# Patient Record
Sex: Male | Born: 1955 | Race: White | Hispanic: No | Marital: Married | Smoking: Never smoker
Health system: Southern US, Community
[De-identification: ages and names within clinical notes are randomized; demographics above are authoritative.]

## PROBLEM LIST (undated history)

## (undated) DIAGNOSIS — C9110 Chronic lymphocytic leukemia of B-cell type not having achieved remission: Secondary | ICD-10-CM

## (undated) DIAGNOSIS — R55 Syncope and collapse: Secondary | ICD-10-CM

## (undated) DIAGNOSIS — Z8669 Personal history of other diseases of the nervous system and sense organs: Secondary | ICD-10-CM

## (undated) DIAGNOSIS — F329 Major depressive disorder, single episode, unspecified: Secondary | ICD-10-CM

## (undated) DIAGNOSIS — I10 Essential (primary) hypertension: Secondary | ICD-10-CM

## (undated) DIAGNOSIS — F419 Anxiety disorder, unspecified: Secondary | ICD-10-CM

## (undated) DIAGNOSIS — R011 Cardiac murmur, unspecified: Secondary | ICD-10-CM

## (undated) DIAGNOSIS — Z87442 Personal history of urinary calculi: Secondary | ICD-10-CM

## (undated) DIAGNOSIS — F32A Depression, unspecified: Secondary | ICD-10-CM

## (undated) DIAGNOSIS — Z8739 Personal history of other diseases of the musculoskeletal system and connective tissue: Secondary | ICD-10-CM

## (undated) DIAGNOSIS — K219 Gastro-esophageal reflux disease without esophagitis: Secondary | ICD-10-CM

## (undated) HISTORY — DX: Cardiac murmur, unspecified: R01.1

## (undated) HISTORY — DX: Depression, unspecified: F32.A

## (undated) HISTORY — DX: Major depressive disorder, single episode, unspecified: F32.9

## (undated) HISTORY — PX: KNEE SURGERY: SHX244

## (undated) HISTORY — DX: Chronic lymphocytic leukemia of B-cell type not having achieved remission: C91.10

## (undated) HISTORY — DX: Personal history of urinary calculi: Z87.442

## (undated) HISTORY — PX: TONSILLECTOMY AND ADENOIDECTOMY: SUR1326

## (undated) HISTORY — DX: Essential (primary) hypertension: I10

## (undated) HISTORY — DX: Syncope and collapse: R55

## (undated) HISTORY — DX: Personal history of other diseases of the musculoskeletal system and connective tissue: Z87.39

## (undated) HISTORY — PX: COLONOSCOPY W/ POLYPECTOMY: SHX1380

## (undated) HISTORY — DX: Gastro-esophageal reflux disease without esophagitis: K21.9

## (undated) HISTORY — DX: Anxiety disorder, unspecified: F41.9

## (undated) HISTORY — DX: Personal history of other diseases of the nervous system and sense organs: Z86.69

---

## 2006-10-13 ENCOUNTER — Ambulatory Visit: Payer: Self-pay | Admitting: Emergency Medicine

## 2006-11-03 ENCOUNTER — Ambulatory Visit: Payer: Self-pay | Admitting: Internal Medicine

## 2007-02-07 ENCOUNTER — Ambulatory Visit: Payer: Self-pay | Admitting: Family Medicine

## 2007-03-03 ENCOUNTER — Ambulatory Visit: Payer: Self-pay | Admitting: Internal Medicine

## 2007-04-03 ENCOUNTER — Ambulatory Visit: Payer: Self-pay | Admitting: Emergency Medicine

## 2007-05-02 ENCOUNTER — Ambulatory Visit: Payer: Self-pay | Admitting: Emergency Medicine

## 2007-05-18 ENCOUNTER — Ambulatory Visit: Payer: Self-pay | Admitting: Emergency Medicine

## 2007-07-18 ENCOUNTER — Ambulatory Visit: Payer: Self-pay | Admitting: Internal Medicine

## 2007-07-31 ENCOUNTER — Ambulatory Visit: Payer: Self-pay | Admitting: Family Medicine

## 2007-08-05 ENCOUNTER — Ambulatory Visit: Payer: Self-pay | Admitting: Family Medicine

## 2007-08-06 ENCOUNTER — Ambulatory Visit: Payer: Self-pay | Admitting: Oncology

## 2007-08-27 ENCOUNTER — Ambulatory Visit: Payer: Self-pay | Admitting: Oncology

## 2007-09-05 ENCOUNTER — Ambulatory Visit: Payer: Self-pay | Admitting: Oncology

## 2007-10-06 ENCOUNTER — Ambulatory Visit: Payer: Self-pay | Admitting: Oncology

## 2007-11-03 ENCOUNTER — Ambulatory Visit: Payer: Self-pay | Admitting: Oncology

## 2007-11-05 ENCOUNTER — Ambulatory Visit: Payer: Self-pay | Admitting: Oncology

## 2007-11-26 ENCOUNTER — Ambulatory Visit: Payer: Self-pay | Admitting: Internal Medicine

## 2007-12-06 ENCOUNTER — Ambulatory Visit: Payer: Self-pay | Admitting: Oncology

## 2007-12-30 ENCOUNTER — Ambulatory Visit: Payer: Self-pay | Admitting: Family Medicine

## 2008-01-06 ENCOUNTER — Ambulatory Visit: Payer: Self-pay | Admitting: Oncology

## 2008-01-07 ENCOUNTER — Inpatient Hospital Stay: Payer: Self-pay | Admitting: Oncology

## 2008-01-11 ENCOUNTER — Ambulatory Visit: Payer: Self-pay | Admitting: Oncology

## 2008-01-25 ENCOUNTER — Ambulatory Visit: Payer: Self-pay | Admitting: Internal Medicine

## 2008-02-05 ENCOUNTER — Ambulatory Visit: Payer: Self-pay | Admitting: Oncology

## 2008-03-07 ENCOUNTER — Ambulatory Visit: Payer: Self-pay | Admitting: Oncology

## 2008-04-06 ENCOUNTER — Ambulatory Visit: Payer: Self-pay | Admitting: Oncology

## 2008-05-07 ENCOUNTER — Ambulatory Visit: Payer: Self-pay | Admitting: Oncology

## 2008-06-07 ENCOUNTER — Ambulatory Visit: Payer: Self-pay | Admitting: Oncology

## 2008-07-05 ENCOUNTER — Ambulatory Visit: Payer: Self-pay | Admitting: Oncology

## 2008-08-02 ENCOUNTER — Ambulatory Visit: Payer: Self-pay | Admitting: Oncology

## 2008-08-05 ENCOUNTER — Ambulatory Visit: Payer: Self-pay | Admitting: Oncology

## 2008-08-30 ENCOUNTER — Ambulatory Visit: Payer: Self-pay | Admitting: Oncology

## 2008-09-01 ENCOUNTER — Ambulatory Visit: Payer: Self-pay | Admitting: Oncology

## 2008-09-04 ENCOUNTER — Ambulatory Visit: Payer: Self-pay | Admitting: Oncology

## 2008-09-27 ENCOUNTER — Emergency Department: Payer: Self-pay | Admitting: Emergency Medicine

## 2008-10-06 ENCOUNTER — Ambulatory Visit: Payer: Self-pay | Admitting: Internal Medicine

## 2008-11-04 ENCOUNTER — Ambulatory Visit: Payer: Self-pay | Admitting: Oncology

## 2008-12-01 ENCOUNTER — Ambulatory Visit: Payer: Self-pay | Admitting: Oncology

## 2008-12-05 ENCOUNTER — Ambulatory Visit: Payer: Self-pay | Admitting: Oncology

## 2009-01-05 ENCOUNTER — Ambulatory Visit: Payer: Self-pay

## 2009-01-11 ENCOUNTER — Emergency Department: Payer: Self-pay | Admitting: Emergency Medicine

## 2009-01-14 ENCOUNTER — Ambulatory Visit: Payer: Self-pay | Admitting: Internal Medicine

## 2009-01-18 ENCOUNTER — Ambulatory Visit: Payer: Self-pay

## 2009-02-04 ENCOUNTER — Ambulatory Visit: Payer: Self-pay

## 2009-02-22 ENCOUNTER — Ambulatory Visit: Payer: Self-pay | Admitting: Urology

## 2009-03-07 ENCOUNTER — Ambulatory Visit: Payer: Self-pay

## 2009-03-10 ENCOUNTER — Ambulatory Visit: Payer: Self-pay | Admitting: Urology

## 2009-03-24 ENCOUNTER — Ambulatory Visit: Payer: Self-pay | Admitting: Urology

## 2009-04-06 ENCOUNTER — Ambulatory Visit: Payer: Self-pay | Admitting: Oncology

## 2009-04-19 ENCOUNTER — Ambulatory Visit: Payer: Self-pay | Admitting: Oncology

## 2009-05-07 ENCOUNTER — Ambulatory Visit: Payer: Self-pay | Admitting: Oncology

## 2009-06-07 ENCOUNTER — Ambulatory Visit: Payer: Self-pay | Admitting: Oncology

## 2009-06-15 ENCOUNTER — Ambulatory Visit: Payer: Self-pay | Admitting: Oncology

## 2009-07-05 ENCOUNTER — Ambulatory Visit: Payer: Self-pay | Admitting: Oncology

## 2009-09-04 ENCOUNTER — Ambulatory Visit: Payer: Self-pay | Admitting: Oncology

## 2009-09-21 ENCOUNTER — Ambulatory Visit: Payer: Self-pay | Admitting: Oncology

## 2009-09-25 ENCOUNTER — Ambulatory Visit: Payer: Self-pay | Admitting: Internal Medicine

## 2009-10-05 ENCOUNTER — Ambulatory Visit: Payer: Self-pay | Admitting: Oncology

## 2009-11-04 ENCOUNTER — Ambulatory Visit: Payer: Self-pay | Admitting: Oncology

## 2009-11-14 ENCOUNTER — Ambulatory Visit: Payer: Self-pay

## 2009-12-02 ENCOUNTER — Ambulatory Visit: Payer: Self-pay | Admitting: Oncology

## 2009-12-05 ENCOUNTER — Ambulatory Visit: Payer: Self-pay | Admitting: Oncology

## 2009-12-16 ENCOUNTER — Ambulatory Visit: Payer: Self-pay | Admitting: General Practice

## 2009-12-23 ENCOUNTER — Ambulatory Visit: Payer: Self-pay | Admitting: General Practice

## 2010-01-07 ENCOUNTER — Ambulatory Visit: Payer: Self-pay | Admitting: Internal Medicine

## 2010-01-07 ENCOUNTER — Emergency Department: Payer: Self-pay | Admitting: Emergency Medicine

## 2010-02-04 ENCOUNTER — Ambulatory Visit: Payer: Self-pay | Admitting: Oncology

## 2010-02-14 ENCOUNTER — Ambulatory Visit: Payer: Self-pay | Admitting: Oncology

## 2010-02-16 ENCOUNTER — Ambulatory Visit: Payer: Self-pay | Admitting: Family Medicine

## 2010-03-07 ENCOUNTER — Ambulatory Visit: Payer: Self-pay | Admitting: Oncology

## 2010-04-06 ENCOUNTER — Ambulatory Visit: Payer: Self-pay | Admitting: Oncology

## 2010-05-07 ENCOUNTER — Ambulatory Visit: Payer: Self-pay | Admitting: Oncology

## 2010-07-20 ENCOUNTER — Ambulatory Visit: Payer: Self-pay | Admitting: Oncology

## 2010-08-06 ENCOUNTER — Ambulatory Visit: Payer: Self-pay | Admitting: Oncology

## 2010-08-11 ENCOUNTER — Ambulatory Visit: Payer: Self-pay | Admitting: Internal Medicine

## 2010-09-30 ENCOUNTER — Ambulatory Visit: Payer: Self-pay | Admitting: Family Medicine

## 2010-10-20 ENCOUNTER — Ambulatory Visit: Payer: Self-pay | Admitting: Oncology

## 2010-11-05 ENCOUNTER — Ambulatory Visit: Payer: Self-pay | Admitting: Oncology

## 2011-01-22 ENCOUNTER — Ambulatory Visit: Payer: Self-pay | Admitting: Oncology

## 2011-02-05 ENCOUNTER — Ambulatory Visit: Payer: Self-pay | Admitting: Oncology

## 2011-04-01 ENCOUNTER — Ambulatory Visit: Payer: Self-pay

## 2011-05-09 LAB — HM COLONOSCOPY: HM Colonoscopy: NORMAL

## 2011-05-24 ENCOUNTER — Ambulatory Visit: Payer: Self-pay | Admitting: Oncology

## 2011-05-24 LAB — CBC CANCER CENTER
Basophil #: 0 x10 3/mm (ref 0.0–0.1)
Eosinophil %: 1.1 %
HCT: 37.2 % — ABNORMAL LOW (ref 40.0–52.0)
HGB: 12.9 g/dL — ABNORMAL LOW (ref 13.0–18.0)
Lymphocyte #: 1.3 x10 3/mm (ref 1.0–3.6)
Lymphocyte %: 33.8 %
MCHC: 34.8 g/dL (ref 32.0–36.0)
MCV: 98 fL (ref 80–100)
Monocyte %: 9.1 %
Neutrophil #: 2 x10 3/mm (ref 1.4–6.5)
Neutrophil %: 55 %
RDW: 13.7 % (ref 11.5–14.5)
WBC: 3.7 x10 3/mm — ABNORMAL LOW (ref 3.8–10.6)

## 2011-05-24 LAB — COMPREHENSIVE METABOLIC PANEL
Anion Gap: 12 (ref 7–16)
Bilirubin,Total: 0.9 mg/dL (ref 0.2–1.0)
Chloride: 101 mmol/L (ref 98–107)
Co2: 32 mmol/L (ref 21–32)
EGFR (African American): >60
EGFR (Non-African Amer.): >60
Glucose: 95 mg/dL (ref 65–99)
Osmolality: 288 (ref 275–301)
SGPT (ALT): 53 U/L
Sodium: 145 mmol/L (ref 136–145)
Total Protein: 6.5 g/dL (ref 6.4–8.2)

## 2011-06-08 ENCOUNTER — Ambulatory Visit: Payer: Self-pay | Admitting: Oncology

## 2011-06-28 LAB — COMPREHENSIVE METABOLIC PANEL
Albumin: 3.3 g/dL — ABNORMAL LOW (ref 3.4–5.0)
Alkaline Phosphatase: 124 U/L (ref 50–136)
Anion Gap: 7 (ref 7–16)
Osmolality: 271 (ref 275–301)
Potassium: 3 mmol/L — ABNORMAL LOW (ref 3.5–5.1)
Sodium: 136 mmol/L (ref 136–145)
Total Protein: 7 g/dL (ref 6.4–8.2)

## 2011-06-28 LAB — CBC CANCER CENTER
Basophil #: 0 x10 3/mm (ref 0.0–0.1)
Eosinophil #: 0 x10 3/mm (ref 0.0–0.7)
Eosinophil %: 1.3 %
MCH: 33.4 pg (ref 26.0–34.0)
MCHC: 35.1 g/dL (ref 32.0–36.0)
Monocyte %: 10.4 %
Neutrophil #: 1.8 x10 3/mm (ref 1.4–6.5)
Neutrophil %: 52.1 %
Platelet: 177 x10 3/mm (ref 150–440)
RBC: 4.71 10*6/uL (ref 4.40–5.90)
WBC: 3.5 x10 3/mm — ABNORMAL LOW (ref 3.8–10.6)

## 2011-07-06 ENCOUNTER — Ambulatory Visit: Payer: Self-pay | Admitting: Oncology

## 2011-08-19 ENCOUNTER — Emergency Department: Payer: Self-pay | Admitting: *Deleted

## 2011-09-24 ENCOUNTER — Ambulatory Visit: Payer: Self-pay | Admitting: Oncology

## 2011-09-24 LAB — COMPREHENSIVE METABOLIC PANEL
Albumin: 3.2 g/dL — ABNORMAL LOW (ref 3.4–5.0)
Alkaline Phosphatase: 164 U/L — ABNORMAL HIGH (ref 50–136)
Bilirubin,Total: 1.1 mg/dL — ABNORMAL HIGH (ref 0.2–1.0)
Co2: 23 mmol/L (ref 21–32)
Creatinine: 0.6 mg/dL (ref 0.60–1.30)
EGFR (African American): >60
Glucose: 94 mg/dL (ref 65–99)
Osmolality: 273 (ref 275–301)
Potassium: 3.1 mmol/L — ABNORMAL LOW (ref 3.5–5.1)
SGOT(AST): 380 U/L — ABNORMAL HIGH (ref 15–37)
SGPT (ALT): 176 U/L — ABNORMAL HIGH

## 2011-09-24 LAB — CBC CANCER CENTER
Basophil %: 0.4 %
Eosinophil #: 0 x10 3/mm (ref 0.0–0.7)
Eosinophil %: 1.2 %
Lymphocyte #: 1.7 x10 3/mm (ref 1.0–3.6)
Lymphocyte %: 44.4 %
MCH: 33.2 pg (ref 26.0–34.0)
Monocyte #: 0.6 x10 3/mm (ref 0.2–1.0)
Platelet: 215 x10 3/mm (ref 150–440)
RBC: 4.38 10*6/uL — ABNORMAL LOW (ref 4.40–5.90)
RDW: 13.4 % (ref 11.5–14.5)

## 2011-10-06 ENCOUNTER — Ambulatory Visit: Payer: Self-pay | Admitting: Oncology

## 2011-11-23 ENCOUNTER — Ambulatory Visit: Payer: Self-pay | Admitting: Oncology

## 2011-12-06 ENCOUNTER — Ambulatory Visit: Payer: Self-pay | Admitting: Oncology

## 2011-12-18 ENCOUNTER — Ambulatory Visit: Payer: Self-pay | Admitting: Unknown Physician Specialty

## 2012-01-11 ENCOUNTER — Ambulatory Visit: Payer: Self-pay | Admitting: Unknown Physician Specialty

## 2012-01-12 ENCOUNTER — Ambulatory Visit: Payer: Self-pay | Admitting: Internal Medicine

## 2012-01-17 ENCOUNTER — Ambulatory Visit: Payer: Self-pay | Admitting: Oncology

## 2012-02-05 ENCOUNTER — Ambulatory Visit: Payer: Self-pay | Admitting: Oncology

## 2012-02-07 LAB — HEPATIC FUNCTION PANEL A (ARMC)
SGOT(AST): 154 U/L — ABNORMAL HIGH (ref 15–37)
Total Protein: 6.2 g/dL — ABNORMAL LOW (ref 6.4–8.2)

## 2012-03-07 ENCOUNTER — Ambulatory Visit: Payer: Self-pay | Admitting: Oncology

## 2012-03-28 ENCOUNTER — Ambulatory Visit: Payer: Self-pay | Admitting: Orthopedic Surgery

## 2012-04-06 HISTORY — PX: KNEE SURGERY: SHX244

## 2012-04-18 LAB — COMPREHENSIVE METABOLIC PANEL
Albumin: 2.7 g/dL — ABNORMAL LOW (ref 3.4–5.0)
Bilirubin,Total: 0.5 mg/dL (ref 0.2–1.0)
Chloride: 95 mmol/L — ABNORMAL LOW (ref 98–107)
Co2: 24 mmol/L (ref 21–32)
EGFR (Non-African Amer.): >60
Osmolality: 272 (ref 275–301)
SGOT(AST): 97 U/L — ABNORMAL HIGH (ref 15–37)
SGPT (ALT): 41 U/L (ref 12–78)
Total Protein: 5.9 g/dL — ABNORMAL LOW (ref 6.4–8.2)

## 2012-04-18 LAB — CBC WITH DIFFERENTIAL/PLATELET
Basophil %: 1.3 %
Eosinophil %: 0.8 %
MCH: 34.2 pg — ABNORMAL HIGH (ref 26.0–34.0)
MCV: 95 fL (ref 80–100)
Monocyte %: 13.9 %
Neutrophil #: 2.7 10*3/uL (ref 1.4–6.5)
Neutrophil %: 55.1 %
Platelet: 362 10*3/uL (ref 150–440)
RBC: 3.62 10*6/uL — ABNORMAL LOW (ref 4.40–5.90)
RDW: 13.7 % (ref 11.5–14.5)
WBC: 5 10*3/uL (ref 3.8–10.6)

## 2012-04-18 LAB — CK TOTAL AND CKMB (NOT AT ARMC)
CK, Total: 37 U/L (ref 35–232)
CK-MB: <0.5 ng/mL — ABNORMAL LOW (ref 0.5–3.6)

## 2012-04-18 LAB — MAGNESIUM: Magnesium: 0.7 mg/dL — ABNORMAL LOW

## 2012-04-18 LAB — TROPONIN I: Troponin-I: <0.02 ng/mL

## 2012-04-19 ENCOUNTER — Observation Stay: Payer: Self-pay | Admitting: Internal Medicine

## 2012-04-19 LAB — BASIC METABOLIC PANEL
Anion Gap: 11 (ref 7–16)
BUN: 5 mg/dL — ABNORMAL LOW (ref 7–18)
Co2: 28 mmol/L (ref 21–32)
Creatinine: 0.74 mg/dL (ref 0.60–1.30)
EGFR (African American): >60
EGFR (Non-African Amer.): >60
Osmolality: 268 (ref 275–301)

## 2012-04-20 LAB — MAGNESIUM: Magnesium: 1.1 mg/dL — ABNORMAL LOW

## 2012-04-20 LAB — POTASSIUM: Potassium: 3.4 mmol/L — ABNORMAL LOW (ref 3.5–5.1)

## 2012-04-21 LAB — BASIC METABOLIC PANEL
Anion Gap: 5 — ABNORMAL LOW (ref 7–16)
BUN: 7 mg/dL (ref 7–18)
Calcium, Total: 7.4 mg/dL — ABNORMAL LOW (ref 8.5–10.1)
Creatinine: 0.54 mg/dL — ABNORMAL LOW (ref 0.60–1.30)
EGFR (African American): >60
EGFR (Non-African Amer.): >60
Glucose: 124 mg/dL — ABNORMAL HIGH (ref 65–99)
Sodium: 141 mmol/L (ref 136–145)

## 2012-04-21 LAB — MAGNESIUM: Magnesium: 1.7 mg/dL — ABNORMAL LOW

## 2012-04-21 LAB — PHOSPHORUS: Phosphorus: 3.3 mg/dL (ref 2.5–4.9)

## 2012-04-28 ENCOUNTER — Ambulatory Visit: Payer: Self-pay | Admitting: Oncology

## 2012-04-28 LAB — CBC CANCER CENTER
Bands: 2 %
HCT: 38.7 % — ABNORMAL LOW (ref 40.0–52.0)
HGB: 13.5 g/dL (ref 13.0–18.0)
MCH: 32.9 pg (ref 26.0–34.0)
MCHC: 34.9 g/dL (ref 32.0–36.0)
MCV: 94 fL (ref 80–100)
Monocytes: 2 %
Platelet: 414 x10 3/mm (ref 150–440)
RDW: 13.4 % (ref 11.5–14.5)
Segmented Neutrophils: 77 %

## 2012-04-28 LAB — COMPREHENSIVE METABOLIC PANEL
Albumin: 3.2 g/dL — ABNORMAL LOW (ref 3.4–5.0)
Alkaline Phosphatase: 132 U/L (ref 50–136)
BUN: 10 mg/dL (ref 7–18)
Bilirubin,Total: 0.8 mg/dL (ref 0.2–1.0)
Chloride: 100 mmol/L (ref 98–107)
Co2: 28 mmol/L (ref 21–32)
EGFR (African American): >60
EGFR (Non-African Amer.): >60
SGOT(AST): 114 U/L — ABNORMAL HIGH (ref 15–37)
SGPT (ALT): 108 U/L — ABNORMAL HIGH (ref 12–78)
Total Protein: 6.5 g/dL (ref 6.4–8.2)

## 2012-04-28 LAB — MAGNESIUM: Magnesium: 1.8 mg/dL

## 2012-05-02 ENCOUNTER — Ambulatory Visit: Payer: Self-pay | Admitting: General Practice

## 2012-05-07 ENCOUNTER — Ambulatory Visit: Payer: Self-pay | Admitting: Oncology

## 2012-06-21 ENCOUNTER — Ambulatory Visit: Payer: Self-pay | Admitting: Family Medicine

## 2012-06-21 LAB — URIC ACID: Uric Acid: 10.5 mg/dL — ABNORMAL HIGH (ref 3.5–7.2)

## 2012-07-24 ENCOUNTER — Ambulatory Visit: Payer: Self-pay | Admitting: Oncology

## 2012-08-07 ENCOUNTER — Emergency Department: Payer: Self-pay | Admitting: Emergency Medicine

## 2012-08-07 ENCOUNTER — Ambulatory Visit: Payer: Self-pay

## 2012-08-07 LAB — COMPREHENSIVE METABOLIC PANEL
Alkaline Phosphatase: 176 U/L — ABNORMAL HIGH (ref 50–136)
Anion Gap: 16 (ref 7–16)
BUN: 8 mg/dL (ref 7–18)
Bilirubin,Total: 1.4 mg/dL — ABNORMAL HIGH (ref 0.2–1.0)
Calcium, Total: 7.8 mg/dL — ABNORMAL LOW (ref 8.5–10.1)
Co2: 25 mmol/L (ref 21–32)
Creatinine: 0.72 mg/dL (ref 0.60–1.30)
EGFR (Non-African Amer.): >60
Glucose: 86 mg/dL (ref 65–99)
Osmolality: 275 (ref 275–301)
SGOT(AST): 441 U/L — ABNORMAL HIGH (ref 15–37)
Total Protein: 6.2 g/dL — ABNORMAL LOW (ref 6.4–8.2)

## 2012-08-07 LAB — CBC
MCHC: 33.5 g/dL (ref 32.0–36.0)
Platelet: 175 10*3/uL (ref 150–440)
RBC: 4.04 10*6/uL — ABNORMAL LOW (ref 4.40–5.90)
RDW: 14 % (ref 11.5–14.5)
WBC: 5.4 10*3/uL (ref 3.8–10.6)

## 2012-08-07 LAB — URINALYSIS, COMPLETE
Bacteria: NONE SEEN
Bilirubin,UR: NEGATIVE
Blood: NEGATIVE
Glucose,UR: NEGATIVE mg/dL (ref 0–75)
Nitrite: NEGATIVE
RBC,UR: NONE SEEN /HPF (ref 0–5)
Squamous Epithelial: NONE SEEN

## 2012-08-07 LAB — TROPONIN I: Troponin-I: <0.02 ng/mL

## 2012-10-15 ENCOUNTER — Inpatient Hospital Stay: Payer: Self-pay | Admitting: Internal Medicine

## 2012-10-15 LAB — CBC WITH DIFFERENTIAL/PLATELET
Basophil #: 0 10*3/uL (ref 0.0–0.1)
Eosinophil #: 0 10*3/uL (ref 0.0–0.7)
Eosinophil %: 0.2 %
HCT: 41.7 % (ref 40.0–52.0)
HGB: 14 g/dL (ref 13.0–18.0)
Lymphocyte #: 1.9 10*3/uL (ref 1.0–3.6)
Lymphocyte %: 29.3 %
MCH: 34.1 pg — ABNORMAL HIGH (ref 26.0–34.0)
MCV: 101 fL — ABNORMAL HIGH (ref 80–100)
Monocyte %: 16.5 %
Neutrophil #: 3.5 10*3/uL (ref 1.4–6.5)
Neutrophil %: 53.6 %
Platelet: 161 10*3/uL (ref 150–440)
WBC: 6.4 10*3/uL (ref 3.8–10.6)

## 2012-10-15 LAB — URINALYSIS, COMPLETE
Bilirubin,UR: NEGATIVE
Glucose,UR: NEGATIVE mg/dL (ref 0–75)
Hyaline Cast: 1
Leukocyte Esterase: NEGATIVE
Nitrite: NEGATIVE
Ph: 5 (ref 4.5–8.0)
Protein: >=500
RBC,UR: 3 /HPF (ref 0–5)
Specific Gravity: 1.009 (ref 1.003–1.030)

## 2012-10-15 LAB — COMPREHENSIVE METABOLIC PANEL
Anion Gap: 26 — ABNORMAL HIGH (ref 7–16)
BUN: 8 mg/dL (ref 7–18)
Bilirubin,Total: 2.8 mg/dL — ABNORMAL HIGH (ref 0.2–1.0)
Chloride: 93 mmol/L — ABNORMAL LOW (ref 98–107)
Creatinine: 2.73 mg/dL — ABNORMAL HIGH (ref 0.60–1.30)
EGFR (Non-African Amer.): 25 — ABNORMAL LOW
Glucose: 66 mg/dL (ref 65–99)
Osmolality: 263 (ref 275–301)
SGPT (ALT): 117 U/L — ABNORMAL HIGH (ref 12–78)
Sodium: 133 mmol/L — ABNORMAL LOW (ref 136–145)
Total Protein: 6.7 g/dL (ref 6.4–8.2)

## 2012-10-15 LAB — LIPASE, BLOOD: Lipase: 77 U/L (ref 73–393)

## 2012-10-15 LAB — MAGNESIUM: Magnesium: 1.1 mg/dL — ABNORMAL LOW

## 2012-10-15 LAB — CREATININE, URINE, RANDOM: Creatinine, Urine Random: 76.4 mg/dL (ref 30.0–125.0)

## 2012-10-15 LAB — ETHANOL
Ethanol %: 0.103 % — ABNORMAL HIGH (ref 0.000–0.080)
Ethanol: 103 mg/dL

## 2012-10-15 LAB — CK TOTAL AND CKMB (NOT AT ARMC): CK-MB: 2 ng/mL (ref 0.5–3.6)

## 2012-10-15 LAB — TROPONIN I: Troponin-I: <0.02 ng/mL

## 2012-10-16 LAB — BASIC METABOLIC PANEL
Anion Gap: 20 — ABNORMAL HIGH (ref 7–16)
BUN: 10 mg/dL (ref 7–18)
Calcium, Total: 7.8 mg/dL — ABNORMAL LOW (ref 8.5–10.1)
Chloride: 101 mmol/L (ref 98–107)
Creatinine: 1.79 mg/dL — ABNORMAL HIGH (ref 0.60–1.30)
EGFR (African American): 48 — ABNORMAL LOW
EGFR (Non-African Amer.): 41 — ABNORMAL LOW
Osmolality: 271 (ref 275–301)
Potassium: 3.8 mmol/L (ref 3.5–5.1)

## 2012-10-16 LAB — LIPID PANEL
HDL Cholesterol: 57 mg/dL (ref 40–60)
Triglycerides: 141 mg/dL (ref 0–200)

## 2012-10-16 LAB — CBC WITH DIFFERENTIAL/PLATELET
HGB: 12.8 g/dL — ABNORMAL LOW (ref 13.0–18.0)
Lymphocyte #: 0.8 10*3/uL — ABNORMAL LOW (ref 1.0–3.6)
Lymphocyte %: 22.6 %
MCH: 34.6 pg — ABNORMAL HIGH (ref 26.0–34.0)
MCHC: 34.4 g/dL (ref 32.0–36.0)
MCV: 101 fL — ABNORMAL HIGH (ref 80–100)
Monocyte %: 17.2 %
Neutrophil #: 2.2 10*3/uL (ref 1.4–6.5)
Neutrophil %: 59.5 %
Platelet: 102 10*3/uL — ABNORMAL LOW (ref 150–440)
RBC: 3.69 10*6/uL — ABNORMAL LOW (ref 4.40–5.90)
RDW: 13.2 % (ref 11.5–14.5)
WBC: 3.8 10*3/uL (ref 3.8–10.6)

## 2012-10-16 LAB — HEPATIC FUNCTION PANEL A (ARMC)
Albumin: 2.8 g/dL — ABNORMAL LOW (ref 3.4–5.0)
SGOT(AST): 293 U/L — ABNORMAL HIGH (ref 15–37)
Total Protein: 6 g/dL — ABNORMAL LOW (ref 6.4–8.2)

## 2012-10-17 LAB — BASIC METABOLIC PANEL
Anion Gap: 9 (ref 7–16)
BUN: 8 mg/dL (ref 7–18)
Calcium, Total: 8.3 mg/dL — ABNORMAL LOW (ref 8.5–10.1)
Chloride: 102 mmol/L (ref 98–107)
Creatinine: 1.13 mg/dL (ref 0.60–1.30)
EGFR (African American): >60
Glucose: 94 mg/dL (ref 65–99)
Potassium: 3.4 mmol/L — ABNORMAL LOW (ref 3.5–5.1)
Sodium: 139 mmol/L (ref 136–145)

## 2012-10-18 LAB — COMPREHENSIVE METABOLIC PANEL
Albumin: 2.6 g/dL — ABNORMAL LOW (ref 3.4–5.0)
Alkaline Phosphatase: 193 U/L — ABNORMAL HIGH (ref 50–136)
Bilirubin,Total: 1.9 mg/dL — ABNORMAL HIGH (ref 0.2–1.0)
SGOT(AST): 198 U/L — ABNORMAL HIGH (ref 15–37)
Total Protein: 6.1 g/dL — ABNORMAL LOW (ref 6.4–8.2)

## 2012-10-18 LAB — BASIC METABOLIC PANEL
Anion Gap: 8 (ref 7–16)
Calcium, Total: 8.2 mg/dL — ABNORMAL LOW (ref 8.5–10.1)
Chloride: 101 mmol/L (ref 98–107)
Co2: 30 mmol/L (ref 21–32)
Glucose: 86 mg/dL (ref 65–99)
Osmolality: 275 (ref 275–301)
Sodium: 139 mmol/L (ref 136–145)

## 2012-10-18 LAB — CBC WITH DIFFERENTIAL/PLATELET
Basophil #: 0 10*3/uL (ref 0.0–0.1)
Basophil %: 0.5 %
Eosinophil #: 0 10*3/uL (ref 0.0–0.7)
Lymphocyte #: 1 10*3/uL (ref 1.0–3.6)
Monocyte #: 0.5 x10 3/mm (ref 0.2–1.0)
Monocyte %: 10.6 %
Neutrophil #: 3 10*3/uL (ref 1.4–6.5)
RBC: 3.79 10*6/uL — ABNORMAL LOW (ref 4.40–5.90)
RDW: 13 % (ref 11.5–14.5)
WBC: 4.4 10*3/uL (ref 3.8–10.6)

## 2012-10-18 LAB — MAGNESIUM: Magnesium: 1.5 mg/dL — ABNORMAL LOW

## 2012-10-21 ENCOUNTER — Emergency Department: Payer: Self-pay | Admitting: Emergency Medicine

## 2012-10-21 LAB — CBC
HCT: 38.5 % — ABNORMAL LOW (ref 40.0–52.0)
MCH: 33.9 pg (ref 26.0–34.0)
MCHC: 34.7 g/dL (ref 32.0–36.0)
MCV: 98 fL (ref 80–100)
RDW: 13.2 % (ref 11.5–14.5)
WBC: 6.8 10*3/uL (ref 3.8–10.6)

## 2012-10-21 LAB — DIFFERENTIAL
Eosinophil %: 0.5 %
Lymphocyte #: 1.1 10*3/uL (ref 1.0–3.6)
Monocyte %: 19.8 %
Neutrophil %: 62.6 %

## 2012-10-21 LAB — COMPREHENSIVE METABOLIC PANEL
Albumin: 2.9 g/dL — ABNORMAL LOW (ref 3.4–5.0)
Alkaline Phosphatase: 173 U/L — ABNORMAL HIGH (ref 50–136)
Bilirubin,Total: 1.4 mg/dL — ABNORMAL HIGH (ref 0.2–1.0)
Creatinine: 0.71 mg/dL (ref 0.60–1.30)
EGFR (African American): >60
EGFR (Non-African Amer.): >60
Osmolality: 268 (ref 275–301)
SGOT(AST): 69 U/L — ABNORMAL HIGH (ref 15–37)
Sodium: 134 mmol/L — ABNORMAL LOW (ref 136–145)

## 2012-10-21 LAB — URINALYSIS, COMPLETE
Bacteria: NONE SEEN
Bilirubin,UR: NEGATIVE
Glucose,UR: NEGATIVE mg/dL (ref 0–75)
Nitrite: NEGATIVE
RBC,UR: 1 /HPF (ref 0–5)
Specific Gravity: 1.011 (ref 1.003–1.030)
WBC UR: 1 /HPF (ref 0–5)

## 2012-10-21 LAB — SEDIMENTATION RATE: Erythrocyte Sed Rate: 55 mm/hr — ABNORMAL HIGH (ref 0–20)

## 2012-10-21 LAB — URIC ACID: Uric Acid: 4.4 mg/dL (ref 3.5–7.2)

## 2012-10-29 ENCOUNTER — Emergency Department: Payer: Self-pay | Admitting: Unknown Physician Specialty

## 2012-10-29 LAB — COMPREHENSIVE METABOLIC PANEL
Albumin: 3.4 g/dL (ref 3.4–5.0)
Anion Gap: 11 (ref 7–16)
Calcium, Total: 10.1 mg/dL (ref 8.5–10.1)
EGFR (African American): >60
EGFR (Non-African Amer.): >60
Osmolality: 277 (ref 275–301)
SGOT(AST): 62 U/L — ABNORMAL HIGH (ref 15–37)
SGPT (ALT): 121 U/L — ABNORMAL HIGH (ref 12–78)
Sodium: 135 mmol/L — ABNORMAL LOW (ref 136–145)
Total Protein: 7.3 g/dL (ref 6.4–8.2)

## 2012-10-29 LAB — TROPONIN I: Troponin-I: <0.02 ng/mL

## 2012-10-29 LAB — CBC
HCT: 38.4 % — ABNORMAL LOW (ref 40.0–52.0)
MCH: 33.8 pg (ref 26.0–34.0)
RBC: 3.91 10*6/uL — ABNORMAL LOW (ref 4.40–5.90)
RDW: 13.5 % (ref 11.5–14.5)

## 2012-10-29 LAB — CBC WITH DIFFERENTIAL/PLATELET
Basophil #: 0.2 10*3/uL — ABNORMAL HIGH (ref 0.0–0.1)
Basophil %: 1.8 %
Eosinophil #: 0.1 10*3/uL (ref 0.0–0.7)
Eosinophil %: 1.1 %
HGB: 14.5 g/dL (ref 13.0–18.0)
MCHC: 34.1 g/dL (ref 32.0–36.0)
MCV: 98 fL (ref 80–100)
Neutrophil #: 6.8 10*3/uL — ABNORMAL HIGH (ref 1.4–6.5)
Neutrophil %: 68.7 %
RBC: 4.32 10*6/uL — ABNORMAL LOW (ref 4.40–5.90)
RDW: 13.6 % (ref 11.5–14.5)

## 2012-10-29 LAB — BASIC METABOLIC PANEL
Creatinine: 1.24 mg/dL (ref 0.60–1.30)
Osmolality: 277 (ref 275–301)
Potassium: 5.6 mmol/L — ABNORMAL HIGH (ref 3.5–5.1)
Sodium: 135 mmol/L — ABNORMAL LOW (ref 136–145)

## 2012-10-31 ENCOUNTER — Observation Stay: Payer: Self-pay | Admitting: Family Medicine

## 2012-10-31 ENCOUNTER — Ambulatory Visit: Payer: Self-pay | Admitting: Family Medicine

## 2012-10-31 LAB — COMPREHENSIVE METABOLIC PANEL
Albumin: 2.9 g/dL — ABNORMAL LOW (ref 3.4–5.0)
Anion Gap: 10 (ref 7–16)
BUN: 22 mg/dL — ABNORMAL HIGH (ref 7–18)
Bilirubin,Total: 0.6 mg/dL (ref 0.2–1.0)
Chloride: 109 mmol/L — ABNORMAL HIGH (ref 98–107)
EGFR (African American): >60
EGFR (Non-African Amer.): >60
Osmolality: 283 (ref 275–301)
Potassium: 4.3 mmol/L (ref 3.5–5.1)
SGOT(AST): 40 U/L — ABNORMAL HIGH (ref 15–37)
SGPT (ALT): 78 U/L (ref 12–78)
Sodium: 141 mmol/L (ref 136–145)
Total Protein: 5.9 g/dL — ABNORMAL LOW (ref 6.4–8.2)

## 2012-10-31 LAB — CBC
HCT: 38.5 % — ABNORMAL LOW (ref 40.0–52.0)
HGB: 13.1 g/dL (ref 13.0–18.0)
MCH: 33.4 pg (ref 26.0–34.0)
Platelet: 528 10*3/uL — ABNORMAL HIGH (ref 150–440)

## 2012-10-31 LAB — URINALYSIS, COMPLETE
Bacteria: NONE SEEN
Blood: NEGATIVE
Glucose,UR: NEGATIVE mg/dL (ref 0–75)
Hyaline Cast: 31
Ketone: NEGATIVE
Leukocyte Esterase: NEGATIVE
Ph: 5 (ref 4.5–8.0)
Specific Gravity: 1.015 (ref 1.003–1.030)

## 2012-11-01 LAB — BASIC METABOLIC PANEL
Anion Gap: 3 — ABNORMAL LOW (ref 7–16)
BUN: 24 mg/dL — ABNORMAL HIGH (ref 7–18)
Calcium, Total: 9.1 mg/dL (ref 8.5–10.1)
Chloride: 104 mmol/L (ref 98–107)
Co2: 30 mmol/L (ref 21–32)
EGFR (African American): >60
EGFR (Non-African Amer.): >60
Potassium: 5.2 mmol/L — ABNORMAL HIGH (ref 3.5–5.1)

## 2012-11-01 LAB — CBC WITH DIFFERENTIAL/PLATELET
Basophil %: 1.4 %
Eosinophil #: 0.1 10*3/uL (ref 0.0–0.7)
Eosinophil %: 0.9 %
HCT: 38.7 % — ABNORMAL LOW (ref 40.0–52.0)
HGB: 13.4 g/dL (ref 13.0–18.0)
Lymphocyte #: 1.7 10*3/uL (ref 1.0–3.6)
MCH: 33.6 pg (ref 26.0–34.0)
Monocyte #: 1 x10 3/mm (ref 0.2–1.0)
Neutrophil %: 67.7 %
RDW: 13.3 % (ref 11.5–14.5)

## 2012-11-01 LAB — TROPONIN I: Troponin-I: <0.02 ng/mL

## 2012-11-01 LAB — CK TOTAL AND CKMB (NOT AT ARMC)
CK, Total: 14 U/L — ABNORMAL LOW (ref 35–232)
CK-MB: <0.5 ng/mL — ABNORMAL LOW (ref 0.5–3.6)

## 2012-11-02 LAB — BASIC METABOLIC PANEL
Anion Gap: 7 (ref 7–16)
BUN: 13 mg/dL (ref 7–18)
Calcium, Total: 9.5 mg/dL (ref 8.5–10.1)
Chloride: 99 mmol/L (ref 98–107)
Co2: 29 mmol/L (ref 21–32)
Creatinine: 0.68 mg/dL (ref 0.60–1.30)
EGFR (Non-African Amer.): >60
Sodium: 135 mmol/L — ABNORMAL LOW (ref 136–145)

## 2012-11-02 LAB — CBC WITH DIFFERENTIAL/PLATELET
Basophil #: 0 10*3/uL (ref 0.0–0.1)
Basophil %: 0.4 %
Eosinophil %: 0.1 %
HCT: 37.4 % — ABNORMAL LOW (ref 40.0–52.0)
HGB: 13 g/dL (ref 13.0–18.0)
Lymphocyte %: 12.7 %
MCH: 33.8 pg (ref 26.0–34.0)
MCHC: 34.7 g/dL (ref 32.0–36.0)
MCV: 97 fL (ref 80–100)
Monocyte %: 10.3 %
Platelet: 374 10*3/uL (ref 150–440)
RBC: 3.85 10*6/uL — ABNORMAL LOW (ref 4.40–5.90)
RDW: 13.1 % (ref 11.5–14.5)
WBC: 7 10*3/uL (ref 3.8–10.6)

## 2012-11-03 ENCOUNTER — Telehealth: Payer: Self-pay

## 2012-11-03 NOTE — Telephone Encounter (Signed)
Message copied by Marcelle Overlie on Mon Nov 03, 2012 11:27 AM ------      Message from: Coralee Rud      Created: Mon Nov 03, 2012 10:59 AM      Regarding: tcm/ph       Dr Mariah Milling 11/04/12 3:15 ------

## 2012-11-03 NOTE — Telephone Encounter (Signed)
tcm

## 2012-11-04 ENCOUNTER — Ambulatory Visit (INDEPENDENT_AMBULATORY_CARE_PROVIDER_SITE_OTHER): Payer: Managed Care, Other (non HMO) | Admitting: Cardiovascular Disease

## 2012-11-04 ENCOUNTER — Encounter: Payer: Self-pay | Admitting: Cardiovascular Disease

## 2012-11-04 VITALS — BP 150/100 | HR 70 | Ht 70.0 in | Wt 222.5 lb

## 2012-11-04 DIAGNOSIS — C9110 Chronic lymphocytic leukemia of B-cell type not having achieved remission: Secondary | ICD-10-CM

## 2012-11-04 DIAGNOSIS — I10 Essential (primary) hypertension: Secondary | ICD-10-CM

## 2012-11-04 DIAGNOSIS — R55 Syncope and collapse: Secondary | ICD-10-CM | POA: Insufficient documentation

## 2012-11-04 MED ORDER — LISINOPRIL 5 MG PO TABS: 5.0000 mg | ORAL_TABLET | Freq: Every day | ORAL | Status: DC

## 2012-11-04 NOTE — Assessment & Plan Note (Addendum)
Prior treatment. We'll discuss further with him if needed to determine if prior treatment could be contributing to his symptoms.

## 2012-11-04 NOTE — Telephone Encounter (Signed)
Has appt today 7/1

## 2012-11-04 NOTE — Assessment & Plan Note (Signed)
Would continue lisinopril 5 mg now. Blood pressure ranging between 116 and 145 his discharge.

## 2012-11-04 NOTE — Patient Instructions (Addendum)
You are doing well. No medication changes were made.  Please call us if you have new issues that need to be addressed before your next appt.    

## 2012-11-04 NOTE — Assessment & Plan Note (Signed)
Etiology not clear apart from orthostatic hypotension, unable to exclude vasovagal syncope, exacerbated by a pressure medications. Unable to exclude arrhythmia. We have encouraged him to closely monitor his blood pressure, stay on low-dose lisinopril. If blood pressure runs low, we have suggested he call our office. Would avoid calcium channel blockers and HCTZ. Avoid strict control of his blood pressure for now given 3 episodes of syncope. Encouraged him to drink plenty of fluids at work if possible and on weekends.  Salt load if needed for low blood pressure.   If he has additional episodes and blood pressure is noted to drop, we'll refer him to Dr. Berton Mount for tilt table testing and further workup. If arrhythmia workup needed, to order a 30 day monitor.

## 2012-11-04 NOTE — Progress Notes (Signed)
Patient ID: [name] [name], male    DOB: 05-May-1956, 57 y.o.   MRN: [id number]  HPI Comments: 57 year old male presenting after recent hospital admission to [facility] for syncope.  He was admitted on 10/31/2012.  Notes indicate he has a history of CLL, previous treatment, gallops, 3 episodes of syncope the most recent at the end of June 2014. He had a gout flare, with shopping and was walking without a walker for the first day in some time. Leaning against a light pole, he had acute syncope without warning. EMTs found systolic pressures in the 60s. He received IV fluids and blood pressure improved. At the time he is on amlodipine 10 mg daily and lisinopril 20 mg daily. Lab work on admission showed creatinine 0.96, BUN 24, potassium 5.2  Workup in the hospital was unremarkable. No arrhythmia on telemetry. Echocardiogram was essentially normal apart from moderately dilated left and right atrium, mild LVH (read by outside physician)  He presents today and reports that he feels well with no complaints. Amlodipine was held at discharge. He was continued on lisinopril 5 mg daily . He is scheduled to go back to work in several days' time.  EKG today shows normal sinus rhythm with rate 70 beats per minute with no significant ST or T wave changes.          Outpatient Encounter Prescriptions as of 11/04/2012  Medication Sig Dispense Refill  . colchicine 0.6 MG tablet Take 0.6 mg by mouth daily as needed.      . Cyanocobalamin (VITAMIN B 12 PO) Take by mouth every 30 (thirty) days.       Marland Kitchen lisinopril (PRINIVIL,ZESTRIL) 5 MG tablet Take 1 tablet (5 mg total) by mouth daily.  90 tablet  3  . Multiple Vitamin (MULTIVITAMIN) tablet Take 1 tablet by mouth daily.      Marland Kitchen omeprazole (PRILOSEC) 10 MG capsule Take 10 mg by mouth daily.      Marland Kitchen oxyCODONE (OXYCONTIN) 10 MG 12 hr tablet Take 10 mg by mouth every 4 (four) hours as needed for pain.      . predniSONE (DELTASONE) 20 MG tablet Take 20 mg by mouth daily. taper       . [DISCONTINUED] lisinopril (PRINIVIL,ZESTRIL) 5 MG tablet Take 5 mg by mouth daily.        Review of Systems  Constitutional: Negative.   HENT: Negative.   Eyes: Negative.   Respiratory: Negative.   Cardiovascular: Negative.   Gastrointestinal: Negative.   Musculoskeletal: Negative.   Skin: Negative.   Neurological: Positive for syncope.  Psychiatric/Behavioral: Negative.   All other systems reviewed and are negative.    BP 150/100  Pulse 70  Ht 5\' 10"  (1.778 m)  Wt 222 lb 8 oz (100.925 kg)  BMI 31.93 kg/m2   Physical Exam  Nursing note and vitals reviewed. Constitutional: He is oriented to person, place, and time. He appears well-developed and well-nourished.  HENT:  Head: Normocephalic.  Nose: Nose normal.  Mouth/Throat: Oropharynx is clear and moist.  Eyes: Conjunctivae are normal. Pupils are equal, round, and reactive to light.  Neck: Normal range of motion. Neck supple. No JVD present.  Cardiovascular: Normal rate, regular rhythm, S1 normal, S2 normal, normal heart sounds and intact distal pulses.  Exam reveals no gallop and no friction rub.   No murmur heard. Pulmonary/Chest: Effort normal and breath sounds normal. No respiratory distress. He has no wheezes. He has no rales. He exhibits no tenderness.  Abdominal: Soft. Bowel sounds  are normal. He exhibits no distension. There is no tenderness.  Musculoskeletal: Normal range of motion. He exhibits no edema and no tenderness.  Lymphadenopathy:    He has no cervical adenopathy.  Neurological: He is alert and oriented to person, place, and time. Coordination normal.  Skin: Skin is warm and dry. No rash noted. No erythema.  Psychiatric: He has a normal mood and affect. His behavior is normal. Judgment and thought content normal.      Assessment and Plan

## 2012-11-05 ENCOUNTER — Telehealth: Payer: Self-pay

## 2012-11-05 NOTE — Telephone Encounter (Signed)
Request from [facility], sent to [facility] on 11/06/2012.

## 2012-11-09 ENCOUNTER — Telehealth: Payer: Self-pay | Admitting: Physician Assistant

## 2012-11-09 ENCOUNTER — Inpatient Hospital Stay: Payer: Self-pay | Admitting: Internal Medicine

## 2012-11-09 LAB — CBC
HCT: 42 % (ref 40.0–52.0)
MCH: 32.1 pg (ref 26.0–34.0)
MCHC: 33.5 g/dL (ref 32.0–36.0)
RBC: 4.38 10*6/uL — ABNORMAL LOW (ref 4.40–5.90)
RDW: 12.9 % (ref 11.5–14.5)

## 2012-11-09 LAB — COMPREHENSIVE METABOLIC PANEL
Albumin: 3.6 g/dL (ref 3.4–5.0)
BUN: 12 mg/dL (ref 7–18)
Calcium, Total: 8.7 mg/dL (ref 8.5–10.1)
Chloride: 104 mmol/L (ref 98–107)
Co2: 32 mmol/L (ref 21–32)
Creatinine: 0.67 mg/dL (ref 0.60–1.30)
EGFR (African American): >60
Glucose: 87 mg/dL (ref 65–99)
Sodium: 142 mmol/L (ref 136–145)

## 2012-11-09 LAB — URINALYSIS, COMPLETE
Bacteria: NONE SEEN
Blood: NEGATIVE
Glucose,UR: NEGATIVE mg/dL (ref 0–75)
Nitrite: NEGATIVE
Protein: NEGATIVE
RBC,UR: NONE SEEN /HPF (ref 0–5)
Specific Gravity: 1.01 (ref 1.003–1.030)
Squamous Epithelial: NONE SEEN

## 2012-11-09 LAB — TROPONIN I: Troponin-I: <0.02 ng/mL

## 2012-11-09 NOTE — Telephone Encounter (Signed)
[name] [name] is a 57 y.o. male recently seen by Dr. Julien Nordmann for suspected vasovagal syncope.  His wife called today to report that the patient has had several "episodes" over the past 3 days.  Last episode was this am.  They do not particularly happen with standing.  He is fairly unresponsive during.  He is quite lethargic afterwards.  There is no reported syncope.  No facial droop.  No dysarthria.  Patient is usually back to "normal" after he sleeps for several hours.  He will sometimes c/o a HA.  It sounds like the patient is having some type of generalized seizure.  I have recommended he go to the ED today for evaluation.  His wife agrees to take him.  Tereso Newcomer, PA-C   11/09/2012 10:22 AM

## 2012-11-10 ENCOUNTER — Ambulatory Visit: Payer: Self-pay | Admitting: Neurology

## 2012-11-12 ENCOUNTER — Encounter: Payer: Self-pay | Admitting: *Deleted

## 2012-11-12 NOTE — Telephone Encounter (Signed)
With order a 30 day monitor also as part of his workup Certainly possible he is having arrhythmia causing his symptoms Could start with a 2 day monitor first if he would like before proceeding with a 30 day monitor

## 2012-11-12 NOTE — Telephone Encounter (Signed)
Family call him for followup Does he need neurology referral?

## 2012-11-12 NOTE — Telephone Encounter (Signed)
LMTCB

## 2012-11-12 NOTE — Telephone Encounter (Signed)
Pt was admitted to [facility] and evaluated by neuro Pt reports he is being managed by neuro for possible seizures He has appt with them and PCP next week Also scheduled for EEG He will call us after evaluated for neuro issues to see if he wants cardiac work up

## 2012-11-13 ENCOUNTER — Observation Stay: Payer: Self-pay | Admitting: Internal Medicine

## 2012-11-13 LAB — URINALYSIS, COMPLETE
Bacteria: NONE SEEN
Blood: NEGATIVE
Glucose,UR: NEGATIVE mg/dL (ref 0–75)
Ketone: NEGATIVE
Ph: 5 (ref 4.5–8.0)
Protein: NEGATIVE
RBC,UR: NONE SEEN /HPF (ref 0–5)
Specific Gravity: 1.004 (ref 1.003–1.030)
Squamous Epithelial: NONE SEEN
WBC UR: NONE SEEN /HPF (ref 0–5)

## 2012-11-13 LAB — CBC
HCT: 40.8 % (ref 40.0–52.0)
MCH: 32.7 pg (ref 26.0–34.0)
MCHC: 33.9 g/dL (ref 32.0–36.0)
MCV: 96 fL (ref 80–100)
Platelet: 146 10*3/uL — ABNORMAL LOW (ref 150–440)
RDW: 13.2 % (ref 11.5–14.5)
WBC: 5.1 10*3/uL (ref 3.8–10.6)

## 2012-11-13 LAB — BASIC METABOLIC PANEL
Anion Gap: 11 (ref 7–16)
BUN: 17 mg/dL (ref 7–18)
Calcium, Total: 8.9 mg/dL (ref 8.5–10.1)
Chloride: 104 mmol/L (ref 98–107)
Creatinine: 0.59 mg/dL — ABNORMAL LOW (ref 0.60–1.30)
EGFR (African American): >60
Glucose: 70 mg/dL (ref 65–99)
Osmolality: 279 (ref 275–301)
Potassium: 4.4 mmol/L (ref 3.5–5.1)
Sodium: 140 mmol/L (ref 136–145)

## 2012-11-13 LAB — ETHANOL: Ethanol %: 0.243 % — ABNORMAL HIGH (ref 0.000–0.080)

## 2012-11-14 DIAGNOSIS — R55 Syncope and collapse: Secondary | ICD-10-CM

## 2012-11-14 LAB — CBC WITH DIFFERENTIAL/PLATELET
Basophil #: 0 10*3/uL (ref 0.0–0.1)
Basophil %: 0.6 %
Eosinophil #: 0.1 10*3/uL (ref 0.0–0.7)
HCT: 38.4 % — ABNORMAL LOW (ref 40.0–52.0)
HGB: 13.3 g/dL (ref 13.0–18.0)
Lymphocyte %: 21.3 %
MCH: 32.9 pg (ref 26.0–34.0)
MCHC: 34.7 g/dL (ref 32.0–36.0)
Monocyte %: 7.8 %
Neutrophil %: 68.4 %
Platelet: 120 10*3/uL — ABNORMAL LOW (ref 150–440)
RBC: 4.05 10*6/uL — ABNORMAL LOW (ref 4.40–5.90)
RDW: 12.6 % (ref 11.5–14.5)
WBC: 7.1 10*3/uL (ref 3.8–10.6)

## 2012-11-14 LAB — CK-MB
CK-MB: <0.5 ng/mL — ABNORMAL LOW (ref 0.5–3.6)
CK-MB: <0.5 ng/mL — ABNORMAL LOW (ref 0.5–3.6)
CK-MB: <0.5 ng/mL — ABNORMAL LOW (ref 0.5–3.6)

## 2012-11-14 LAB — BASIC METABOLIC PANEL
Calcium, Total: 8.7 mg/dL (ref 8.5–10.1)
Chloride: 104 mmol/L (ref 98–107)
Creatinine: 0.76 mg/dL (ref 0.60–1.30)
EGFR (Non-African Amer.): >60
Glucose: 79 mg/dL (ref 65–99)
Sodium: 140 mmol/L (ref 136–145)

## 2012-11-14 LAB — TROPONIN I
Troponin-I: <0.02 ng/mL
Troponin-I: <0.02 ng/mL

## 2012-11-18 DIAGNOSIS — R55 Syncope and collapse: Secondary | ICD-10-CM

## 2012-11-22 DIAGNOSIS — R55 Syncope and collapse: Secondary | ICD-10-CM

## 2012-11-26 ENCOUNTER — Encounter: Payer: Self-pay | Admitting: Cardiovascular Disease

## 2012-12-19 ENCOUNTER — Telehealth: Payer: Self-pay | Admitting: *Deleted

## 2012-12-19 NOTE — Telephone Encounter (Signed)
They have some questions regard 24 hr monitor.

## 2012-12-22 NOTE — Telephone Encounter (Signed)
The patient would like to have completed Ecardio monitor faxed to [facility]. The patient was instructed to sign a medical release form and we can fax the completed Ecardio monitor to [facility]. The patient will transfer his care to [facility] because he has other problems that he needs to go to [facility] and he does not want to be back and forth and will keep his physicians in one place.

## 2012-12-24 ENCOUNTER — Encounter (INDEPENDENT_AMBULATORY_CARE_PROVIDER_SITE_OTHER): Payer: Managed Care, Other (non HMO)

## 2012-12-31 ENCOUNTER — Ambulatory Visit: Payer: Self-pay | Admitting: Oncology

## 2012-12-31 LAB — CBC CANCER CENTER
Basophil #: 0 x10 3/mm (ref 0.0–0.1)
HCT: 38.2 % — ABNORMAL LOW (ref 40.0–52.0)
HGB: 13.6 g/dL (ref 13.0–18.0)
Lymphocyte %: 32.5 %
MCH: 34 pg (ref 26.0–34.0)
Monocyte #: 0.6 x10 3/mm (ref 0.2–1.0)
Monocyte %: 9.4 %
Neutrophil #: 3.9 x10 3/mm (ref 1.4–6.5)
Neutrophil %: 57.1 %
RBC: 4.01 10*6/uL — ABNORMAL LOW (ref 4.40–5.90)
WBC: 6.7 x10 3/mm (ref 3.8–10.6)

## 2012-12-31 LAB — COMPREHENSIVE METABOLIC PANEL
Albumin: 3.3 g/dL — ABNORMAL LOW (ref 3.4–5.0)
BUN: 8 mg/dL (ref 7–18)
Co2: 26 mmol/L (ref 21–32)
Creatinine: 0.72 mg/dL (ref 0.60–1.30)
EGFR (African American): >60
EGFR (Non-African Amer.): >60
Glucose: 130 mg/dL — ABNORMAL HIGH (ref 65–99)
Potassium: 3.4 mmol/L — ABNORMAL LOW (ref 3.5–5.1)
Sodium: 137 mmol/L (ref 136–145)

## 2013-01-05 ENCOUNTER — Ambulatory Visit: Payer: Self-pay | Admitting: Oncology

## 2013-01-08 LAB — STOOL CULTURE

## 2013-01-22 ENCOUNTER — Other Ambulatory Visit: Payer: Self-pay | Admitting: Unknown Physician Specialty

## 2013-01-22 LAB — CLOSTRIDIUM DIFFICILE BY PCR

## 2013-01-28 ENCOUNTER — Ambulatory Visit: Payer: Self-pay | Admitting: Unknown Physician Specialty

## 2013-01-30 LAB — PATHOLOGY REPORT

## 2013-02-04 ENCOUNTER — Ambulatory Visit: Payer: Self-pay | Admitting: Oncology

## 2013-02-17 ENCOUNTER — Other Ambulatory Visit: Payer: Self-pay | Admitting: Unknown Physician Specialty

## 2013-02-18 ENCOUNTER — Other Ambulatory Visit: Payer: Self-pay | Admitting: *Deleted

## 2013-02-18 DIAGNOSIS — R55 Syncope and collapse: Secondary | ICD-10-CM

## 2013-03-20 DIAGNOSIS — R55 Syncope and collapse: Secondary | ICD-10-CM | POA: Insufficient documentation

## 2013-04-16 ENCOUNTER — Inpatient Hospital Stay: Payer: Self-pay | Admitting: Psychiatry

## 2013-04-16 LAB — ACETAMINOPHEN LEVEL: Acetaminophen: <2 ug/mL

## 2013-04-16 LAB — URINALYSIS, COMPLETE
Bacteria: NONE SEEN
Bilirubin,UR: NEGATIVE
Glucose,UR: NEGATIVE mg/dL (ref 0–75)
Ketone: NEGATIVE
Leukocyte Esterase: NEGATIVE
Nitrite: NEGATIVE
Ph: 5 (ref 4.5–8.0)
Specific Gravity: 1.014 (ref 1.003–1.030)
Squamous Epithelial: NONE SEEN
WBC UR: 1 /HPF (ref 0–5)

## 2013-04-16 LAB — CBC
HCT: 40.1 % (ref 40.0–52.0)
HGB: 13.4 g/dL (ref 13.0–18.0)
MCHC: 33.4 g/dL (ref 32.0–36.0)
MCV: 96 fL (ref 80–100)
Platelet: 111 10*3/uL — ABNORMAL LOW (ref 150–440)
RBC: 4.16 10*6/uL — ABNORMAL LOW (ref 4.40–5.90)
WBC: 6.5 10*3/uL (ref 3.8–10.6)

## 2013-04-16 LAB — COMPREHENSIVE METABOLIC PANEL
Alkaline Phosphatase: 133 U/L — ABNORMAL HIGH
BUN: 14 mg/dL (ref 7–18)
Bilirubin,Total: 1.7 mg/dL — ABNORMAL HIGH (ref 0.2–1.0)
Calcium, Total: 9 mg/dL (ref 8.5–10.1)
Chloride: 100 mmol/L (ref 98–107)
Co2: 25 mmol/L (ref 21–32)
Creatinine: 0.73 mg/dL (ref 0.60–1.30)
EGFR (African American): >60
EGFR (Non-African Amer.): >60
Osmolality: 276 (ref 275–301)
Potassium: 4.2 mmol/L (ref 3.5–5.1)
SGOT(AST): 128 U/L — ABNORMAL HIGH (ref 15–37)
Sodium: 138 mmol/L (ref 136–145)
Total Protein: 6.7 g/dL (ref 6.4–8.2)

## 2013-04-16 LAB — DRUG SCREEN, URINE
Amphetamines, Ur Screen: NEGATIVE (ref ?–1000)
Barbiturates, Ur Screen: NEGATIVE (ref ?–200)
Benzodiazepine, Ur Scrn: NEGATIVE (ref ?–200)
Cannabinoid 50 Ng, Ur ~~LOC~~: NEGATIVE (ref ?–50)
MDMA (Ecstasy)Ur Screen: NEGATIVE (ref ?–500)
Methadone, Ur Screen: NEGATIVE (ref ?–300)
Opiate, Ur Screen: NEGATIVE (ref ?–300)
Phencyclidine (PCP) Ur S: NEGATIVE (ref ?–25)
Tricyclic, Ur Screen: NEGATIVE (ref ?–1000)

## 2013-04-16 LAB — ETHANOL
Ethanol %: 0.306 % (ref 0.000–0.080)
Ethanol: 306 mg/dL

## 2013-04-17 ENCOUNTER — Ambulatory Visit: Payer: Self-pay | Admitting: Oncology

## 2013-04-17 LAB — ETHANOL
Ethanol %: 0.005 % (ref 0.000–0.080)
Ethanol: 5 mg/dL

## 2013-04-20 LAB — CREATININE, SERUM
Creatinine: 0.73 mg/dL (ref 0.60–1.30)
EGFR (African American): >60
EGFR (Non-African Amer.): >60

## 2013-04-20 LAB — HEMOGLOBIN: HGB: 12 g/dL — ABNORMAL LOW (ref 13.0–18.0)

## 2013-04-24 LAB — CBC CANCER CENTER
Basophil %: 1.6 %
Eosinophil #: 0.1 x10 3/mm (ref 0.0–0.7)
Eosinophil %: 2.9 %
HGB: 12.6 g/dL — ABNORMAL LOW (ref 13.0–18.0)
MCH: 32.6 pg (ref 26.0–34.0)
Neutrophil #: 1.5 x10 3/mm (ref 1.4–6.5)
WBC: 4.3 x10 3/mm (ref 3.8–10.6)

## 2013-04-24 LAB — COMPREHENSIVE METABOLIC PANEL
Albumin: 3.1 g/dL — ABNORMAL LOW (ref 3.4–5.0)
Alkaline Phosphatase: 93 U/L
BUN: 10 mg/dL (ref 7–18)
Bilirubin,Total: 0.7 mg/dL (ref 0.2–1.0)
Co2: 33 mmol/L — ABNORMAL HIGH (ref 21–32)
EGFR (African American): >60
EGFR (Non-African Amer.): >60
Glucose: 83 mg/dL (ref 65–99)
Osmolality: 287 (ref 275–301)
Potassium: 3.9 mmol/L (ref 3.5–5.1)
SGPT (ALT): 239 U/L — ABNORMAL HIGH (ref 12–78)
Sodium: 145 mmol/L (ref 136–145)

## 2013-04-24 LAB — LACTATE DEHYDROGENASE: LDH: 151 U/L (ref 85–241)

## 2013-05-07 ENCOUNTER — Ambulatory Visit: Payer: Self-pay | Admitting: Oncology

## 2013-06-05 ENCOUNTER — Emergency Department: Payer: Self-pay | Admitting: Emergency Medicine

## 2013-06-05 LAB — COMPREHENSIVE METABOLIC PANEL
ALBUMIN: 4 g/dL (ref 3.4–5.0)
ALK PHOS: 59 U/L
ANION GAP: 11 (ref 7–16)
AST: 48 U/L — AB (ref 15–37)
BUN: 14 mg/dL (ref 7–18)
Bilirubin,Total: 1.4 mg/dL — ABNORMAL HIGH (ref 0.2–1.0)
Calcium, Total: 9.1 mg/dL (ref 8.5–10.1)
Chloride: 98 mmol/L (ref 98–107)
Co2: 28 mmol/L (ref 21–32)
Creatinine: 0.67 mg/dL (ref 0.60–1.30)
EGFR (African American): >60
EGFR (Non-African Amer.): >60
GLUCOSE: 74 mg/dL (ref 65–99)
Osmolality: 273 (ref 275–301)
POTASSIUM: 3.9 mmol/L (ref 3.5–5.1)
SGPT (ALT): 43 U/L (ref 12–78)
Sodium: 137 mmol/L (ref 136–145)
TOTAL PROTEIN: 7 g/dL (ref 6.4–8.2)

## 2013-06-05 LAB — ETHANOL
Ethanol %: 0.409 % (ref 0.000–0.080)
Ethanol: 409 mg/dL

## 2013-06-05 LAB — DRUG SCREEN, URINE
Amphetamines, Ur Screen: NEGATIVE (ref ?–1000)
BARBITURATES, UR SCREEN: NEGATIVE (ref ?–200)
Benzodiazepine, Ur Scrn: NEGATIVE (ref ?–200)
Cannabinoid 50 Ng, Ur ~~LOC~~: NEGATIVE (ref ?–50)
Cocaine Metabolite,Ur ~~LOC~~: NEGATIVE (ref ?–300)
MDMA (ECSTASY) UR SCREEN: NEGATIVE (ref ?–500)
Methadone, Ur Screen: NEGATIVE (ref ?–300)
Opiate, Ur Screen: NEGATIVE (ref ?–300)
Phencyclidine (PCP) Ur S: NEGATIVE (ref ?–25)
Tricyclic, Ur Screen: NEGATIVE (ref ?–1000)

## 2013-06-05 LAB — CBC
HCT: 44.8 % (ref 40.0–52.0)
HGB: 14.9 g/dL (ref 13.0–18.0)
MCH: 30.1 pg (ref 26.0–34.0)
MCHC: 33.3 g/dL (ref 32.0–36.0)
MCV: 91 fL (ref 80–100)
PLATELETS: 325 10*3/uL (ref 150–440)
RBC: 4.95 10*6/uL (ref 4.40–5.90)
RDW: 14.1 % (ref 11.5–14.5)
WBC: 8 10*3/uL (ref 3.8–10.6)

## 2013-06-05 LAB — URINALYSIS, COMPLETE
Bacteria: NONE SEEN
Bilirubin,UR: NEGATIVE
Glucose,UR: NEGATIVE mg/dL (ref 0–75)
Ketone: NEGATIVE
Leukocyte Esterase: NEGATIVE
NITRITE: NEGATIVE
PROTEIN: NEGATIVE
Ph: 5 (ref 4.5–8.0)
RBC,UR: <1 /HPF (ref 0–5)
SPECIFIC GRAVITY: 1.01 (ref 1.003–1.030)
Squamous Epithelial: NONE SEEN
WBC UR: 1 /HPF (ref 0–5)

## 2013-06-05 LAB — ACETAMINOPHEN LEVEL: Acetaminophen: <2 ug/mL

## 2013-06-05 LAB — SALICYLATE LEVEL: Salicylates, Serum: <1.7 mg/dL

## 2013-08-10 ENCOUNTER — Ambulatory Visit: Payer: Self-pay

## 2013-08-10 LAB — RAPID STREP-A WITH REFLX: MICRO TEXT REPORT: NEGATIVE

## 2013-08-10 LAB — RAPID INFLUENZA A&B ANTIGENS

## 2013-08-12 LAB — BETA STREP CULTURE(ARMC)

## 2013-09-04 HISTORY — PX: CARDIAC ELECTROPHYSIOLOGY MAPPING AND ABLATION: SHX1292

## 2013-09-15 ENCOUNTER — Emergency Department: Payer: Self-pay | Admitting: Emergency Medicine

## 2013-09-15 LAB — DRUG SCREEN, URINE
Amphetamines, Ur Screen: NEGATIVE (ref ?–1000)
BARBITURATES, UR SCREEN: NEGATIVE (ref ?–200)
BENZODIAZEPINE, UR SCRN: NEGATIVE (ref ?–200)
COCAINE METABOLITE, UR ~~LOC~~: NEGATIVE (ref ?–300)
Cannabinoid 50 Ng, Ur ~~LOC~~: NEGATIVE (ref ?–50)
MDMA (ECSTASY) UR SCREEN: NEGATIVE (ref ?–500)
METHADONE, UR SCREEN: NEGATIVE (ref ?–300)
Opiate, Ur Screen: NEGATIVE (ref ?–300)
Phencyclidine (PCP) Ur S: NEGATIVE (ref ?–25)
Tricyclic, Ur Screen: NEGATIVE (ref ?–1000)

## 2013-09-15 LAB — COMPREHENSIVE METABOLIC PANEL
ALK PHOS: 266 U/L — AB
AST: 309 U/L — AB (ref 15–37)
Albumin: 3 g/dL — ABNORMAL LOW (ref 3.4–5.0)
Anion Gap: 13 (ref 7–16)
BILIRUBIN TOTAL: 0.8 mg/dL (ref 0.2–1.0)
BUN: 9 mg/dL (ref 7–18)
CREATININE: 1.14 mg/dL (ref 0.60–1.30)
Calcium, Total: 8.6 mg/dL (ref 8.5–10.1)
Chloride: 101 mmol/L (ref 98–107)
Co2: 26 mmol/L (ref 21–32)
EGFR (African American): >60
Glucose: 81 mg/dL (ref 65–99)
Osmolality: 277 (ref 275–301)
Potassium: 3.9 mmol/L (ref 3.5–5.1)
SGPT (ALT): 132 U/L — ABNORMAL HIGH (ref 12–78)
SODIUM: 140 mmol/L (ref 136–145)
Total Protein: 6.1 g/dL — ABNORMAL LOW (ref 6.4–8.2)

## 2013-09-15 LAB — MAGNESIUM: MAGNESIUM: 1.8 mg/dL

## 2013-09-15 LAB — CBC WITH DIFFERENTIAL/PLATELET
BASOS PCT: 0.7 %
Basophil #: 0 10*3/uL (ref 0.0–0.1)
EOS PCT: 0.5 %
Eosinophil #: 0 10*3/uL (ref 0.0–0.7)
HCT: 38.3 % — AB (ref 40.0–52.0)
HGB: 12.9 g/dL — AB (ref 13.0–18.0)
Lymphocyte #: 3.2 10*3/uL (ref 1.0–3.6)
Lymphocyte %: 51.2 %
MCH: 32.5 pg (ref 26.0–34.0)
MCHC: 33.8 g/dL (ref 32.0–36.0)
MCV: 96 fL (ref 80–100)
Monocyte #: 0.9 x10 3/mm (ref 0.2–1.0)
Monocyte %: 14.6 %
NEUTROS PCT: 33 %
Neutrophil #: 2.1 10*3/uL (ref 1.4–6.5)
PLATELETS: 385 10*3/uL (ref 150–440)
RBC: 3.98 10*6/uL — ABNORMAL LOW (ref 4.40–5.90)
RDW: 19.4 % — AB (ref 11.5–14.5)
WBC: 6.3 10*3/uL (ref 3.8–10.6)

## 2013-09-15 LAB — URINALYSIS, COMPLETE
BLOOD: NEGATIVE
Bilirubin,UR: NEGATIVE
GLUCOSE, UR: NEGATIVE mg/dL (ref 0–75)
Hyaline Cast: 2
KETONE: NEGATIVE
LEUKOCYTE ESTERASE: NEGATIVE
Nitrite: NEGATIVE
PH: 6 (ref 4.5–8.0)
PROTEIN: NEGATIVE
RBC, UR: NONE SEEN /HPF (ref 0–5)
Specific Gravity: 1.009 (ref 1.003–1.030)
Squamous Epithelial: NONE SEEN

## 2013-09-15 LAB — ETHANOL
ETHANOL %: 0.307 % — AB (ref 0.000–0.080)
Ethanol: 307 mg/dL

## 2013-09-15 LAB — APTT: Activated PTT: 32.4 secs (ref 23.6–35.9)

## 2013-09-15 LAB — CK TOTAL AND CKMB (NOT AT ARMC): CK, TOTAL: 17 U/L — AB

## 2013-09-15 LAB — PROTIME-INR
INR: 1.1
PROTHROMBIN TIME: 14.3 s (ref 11.5–14.7)

## 2013-09-15 LAB — LIPASE, BLOOD: Lipase: 76 U/L (ref 73–393)

## 2013-09-15 LAB — TROPONIN I: TROPONIN-I: 0.06 ng/mL — AB

## 2013-09-16 LAB — TROPONIN I: Troponin-I: 0.05 ng/mL

## 2013-10-23 ENCOUNTER — Ambulatory Visit: Payer: Self-pay | Admitting: Oncology

## 2013-10-23 LAB — COMPREHENSIVE METABOLIC PANEL
ALT: 89 U/L — AB (ref 12–78)
Albumin: 2.6 g/dL — ABNORMAL LOW (ref 3.4–5.0)
Alkaline Phosphatase: 372 U/L — ABNORMAL HIGH
Anion Gap: 9 (ref 7–16)
BILIRUBIN TOTAL: 3.6 mg/dL — AB (ref 0.2–1.0)
BUN: 4 mg/dL — ABNORMAL LOW (ref 7–18)
Calcium, Total: 8.4 mg/dL — ABNORMAL LOW (ref 8.5–10.1)
Chloride: 102 mmol/L (ref 98–107)
Co2: 28 mmol/L (ref 21–32)
Creatinine: 0.72 mg/dL (ref 0.60–1.30)
EGFR (Non-African Amer.): >60
GLUCOSE: 94 mg/dL (ref 65–99)
Osmolality: 274 (ref 275–301)
POTASSIUM: 3.5 mmol/L (ref 3.5–5.1)
SGOT(AST): 259 U/L — ABNORMAL HIGH (ref 15–37)
Sodium: 139 mmol/L (ref 136–145)
Total Protein: 6.4 g/dL (ref 6.4–8.2)

## 2013-10-23 LAB — CBC CANCER CENTER
BASOS ABS: 0.2 x10 3/mm — AB (ref 0.0–0.1)
Basophil %: 3 %
EOS ABS: 0.1 x10 3/mm (ref 0.0–0.7)
Eosinophil %: 1.3 %
HCT: 34.1 % — AB (ref 40.0–52.0)
HGB: 11.2 g/dL — ABNORMAL LOW (ref 13.0–18.0)
LYMPHS PCT: 31.9 %
Lymphocyte #: 1.8 x10 3/mm (ref 1.0–3.6)
MCH: 34.1 pg — AB (ref 26.0–34.0)
MCHC: 32.9 g/dL (ref 32.0–36.0)
MCV: 104 fL — AB (ref 80–100)
Monocyte #: 0.8 x10 3/mm (ref 0.2–1.0)
Monocyte %: 13.9 %
NEUTROS PCT: 49.9 %
Neutrophil #: 2.8 x10 3/mm (ref 1.4–6.5)
Platelet: 312 x10 3/mm (ref 150–440)
RBC: 3.28 10*6/uL — ABNORMAL LOW (ref 4.40–5.90)
RDW: 18.3 % — AB (ref 11.5–14.5)
WBC: 5.6 x10 3/mm (ref 3.8–10.6)

## 2013-11-04 ENCOUNTER — Ambulatory Visit: Payer: Self-pay | Admitting: Oncology

## 2014-04-09 ENCOUNTER — Ambulatory Visit: Payer: Self-pay | Admitting: Physician Assistant

## 2014-04-09 ENCOUNTER — Ambulatory Visit: Payer: Self-pay | Admitting: Urology

## 2014-04-26 ENCOUNTER — Ambulatory Visit: Payer: Self-pay | Admitting: Oncology

## 2014-05-12 ENCOUNTER — Ambulatory Visit: Payer: Self-pay | Admitting: Oncology

## 2014-05-12 LAB — COMPREHENSIVE METABOLIC PANEL
ALK PHOS: 398 U/L — AB
ANION GAP: 14 (ref 7–16)
Albumin: 3.2 g/dL — ABNORMAL LOW (ref 3.4–5.0)
BILIRUBIN TOTAL: 1.7 mg/dL — AB (ref 0.2–1.0)
BUN: 5 mg/dL — ABNORMAL LOW (ref 7–18)
CHLORIDE: 103 mmol/L (ref 98–107)
CO2: 29 mmol/L (ref 21–32)
CREATININE: 0.62 mg/dL (ref 0.60–1.30)
Calcium, Total: 8.8 mg/dL (ref 8.5–10.1)
EGFR (African American): >60
Glucose: 87 mg/dL (ref 65–99)
OSMOLALITY: 287 (ref 275–301)
Potassium: 3.6 mmol/L (ref 3.5–5.1)
SGOT(AST): 158 U/L — ABNORMAL HIGH (ref 15–37)
SGPT (ALT): 61 U/L
SODIUM: 146 mmol/L — AB (ref 136–145)
Total Protein: 6.7 g/dL (ref 6.4–8.2)

## 2014-05-12 LAB — CBC CANCER CENTER
BASOS ABS: 0.1 x10 3/mm (ref 0.0–0.1)
Basophil %: 0.6 %
Eosinophil #: 0.2 x10 3/mm (ref 0.0–0.7)
Eosinophil %: 1.6 %
HCT: 39.6 % — AB (ref 40.0–52.0)
HGB: 13.1 g/dL (ref 13.0–18.0)
LYMPHS PCT: 19.9 %
Lymphocyte #: 2.2 x10 3/mm (ref 1.0–3.6)
MCH: 33.4 pg (ref 26.0–34.0)
MCHC: 33.1 g/dL (ref 32.0–36.0)
MCV: 101 fL — ABNORMAL HIGH (ref 80–100)
Monocyte #: 1.1 x10 3/mm — ABNORMAL HIGH (ref 0.2–1.0)
Monocyte %: 9.7 %
NEUTROS ABS: 7.5 x10 3/mm — AB (ref 1.4–6.5)
Neutrophil %: 68.2 %
PLATELETS: 213 x10 3/mm (ref 150–440)
RBC: 3.91 10*6/uL — ABNORMAL LOW (ref 4.40–5.90)
RDW: 14.7 % — AB (ref 11.5–14.5)
WBC: 11 x10 3/mm — AB (ref 3.8–10.6)

## 2014-05-12 LAB — LACTATE DEHYDROGENASE: LDH: 191 U/L (ref 85–241)

## 2014-05-27 IMAGING — CT CT HEAD WITHOUT CONTRAST
1 of 2 series · 13 of 30 positions shown, 17 images · non-contrast
Comparison: none

REASON FOR EXAM: syncop, seizure like activity
COMMENTS:   LMP: (Male)

PROCEDURE:     CT  - CT HEAD WITHOUT CONTRAST  - November 09, 2012 [DATE]
RESULT:     Comparison:  None
TECHNIQUE: Multiple axial images from the foramen magnum to the vertex were
obtained without IV contrast.

[Series 4: soft tissue · axial · 0.48mm/px · z∈[+538,+683]mm · 13 of 35 slices shown, 17 images]
[im 3/35  brain]
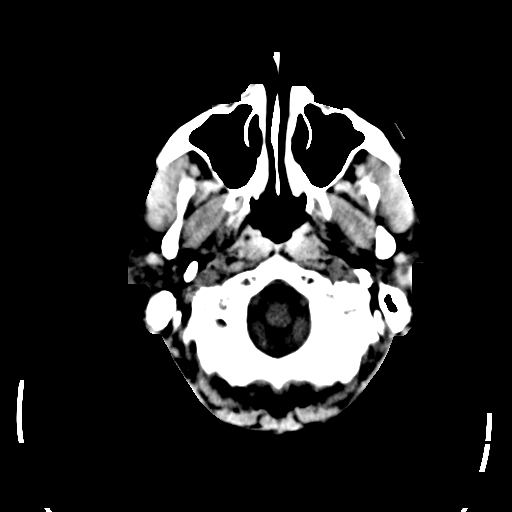
[im 3/35  bone]
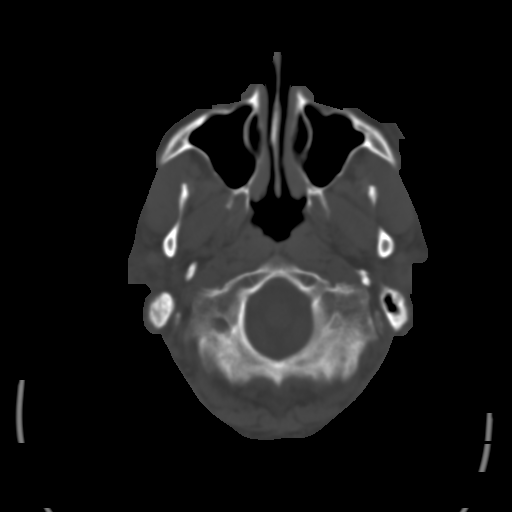
[im 5/35  brain]
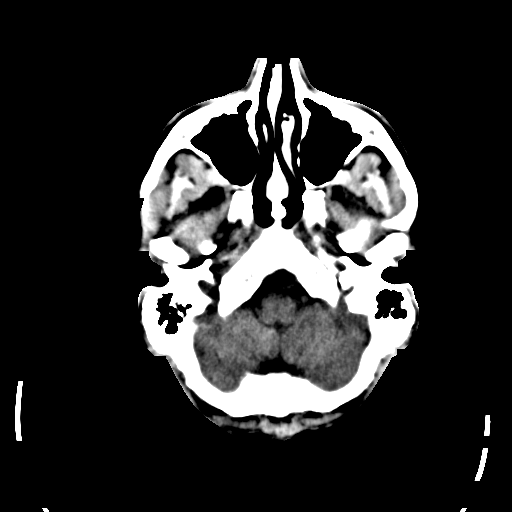
[im 8/35  brain]
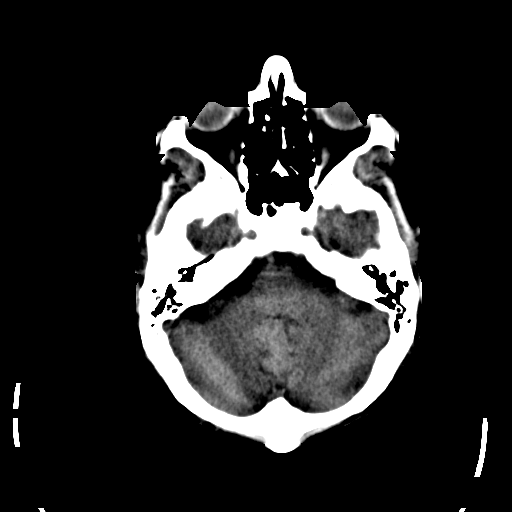
[im 10/35  brain]
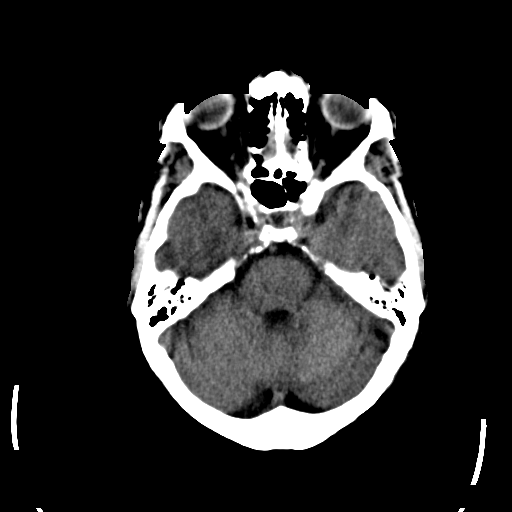
[im 13/35  brain]
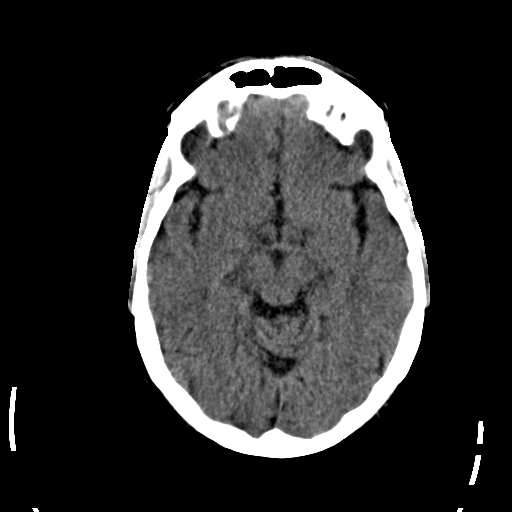
[im 13/35  bone]
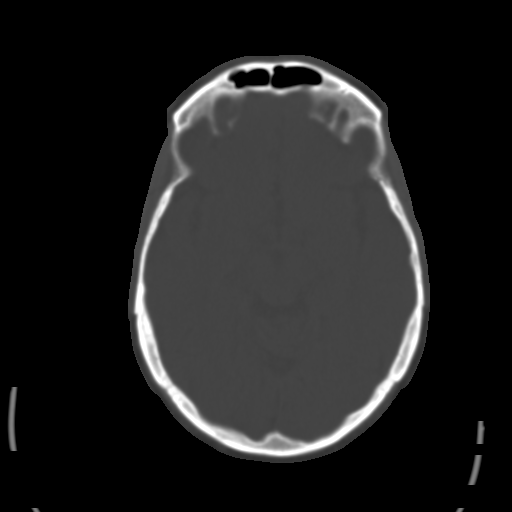
[im 15/35  brain]
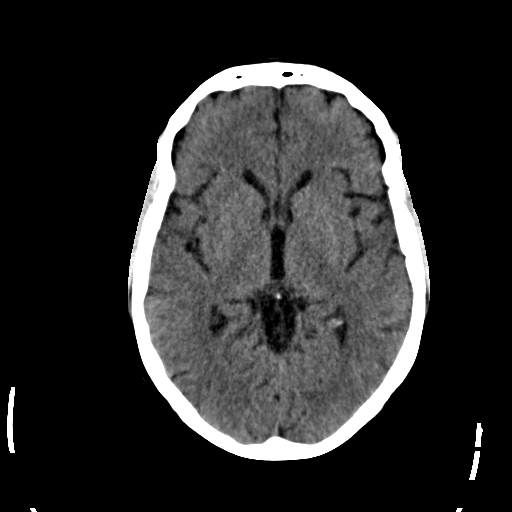
[im 18/35  brain]
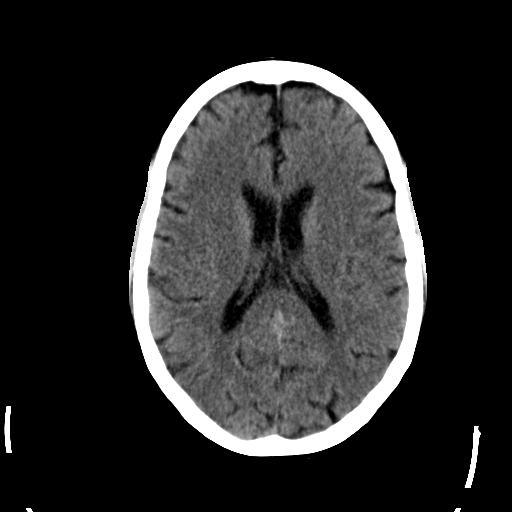
[im 20/35  brain]
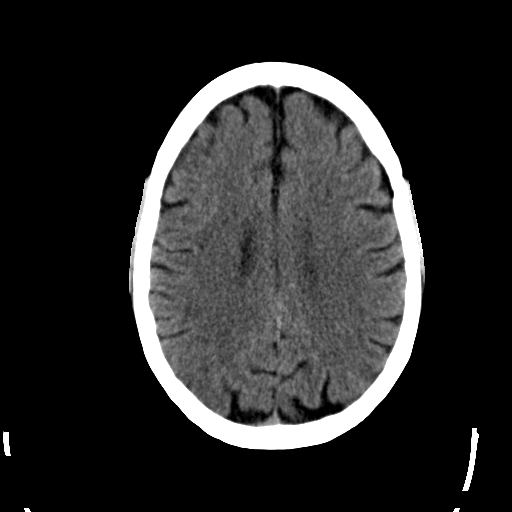
[im 22/35  brain]
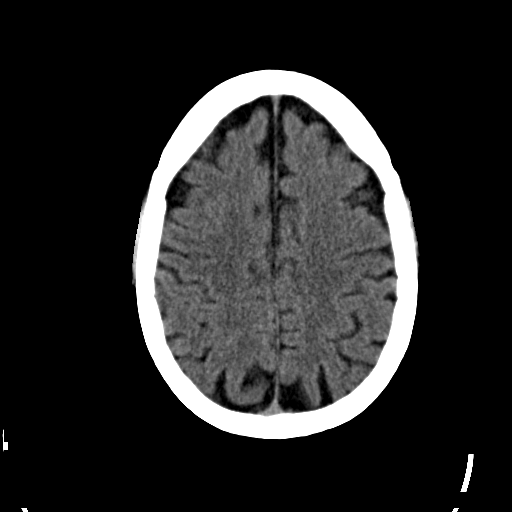
[im 22/35  bone]
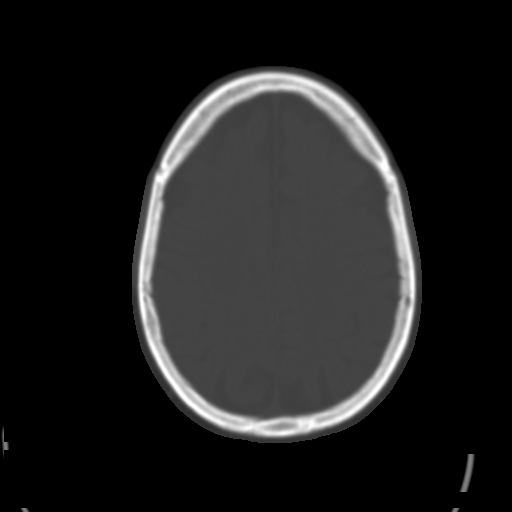
[im 25/35  brain]
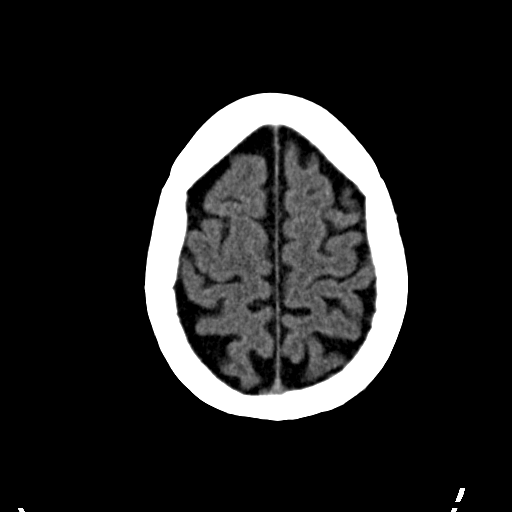
[im 27/35  brain]
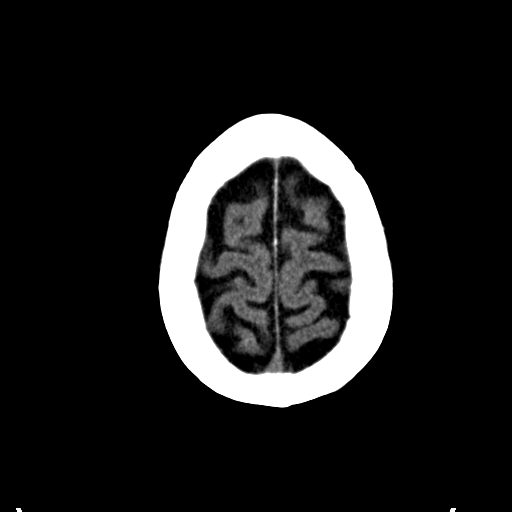
[im 30/35  brain]
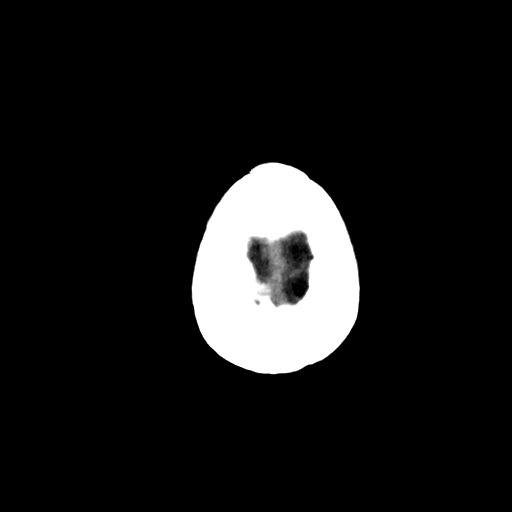
[im 32/35  brain]
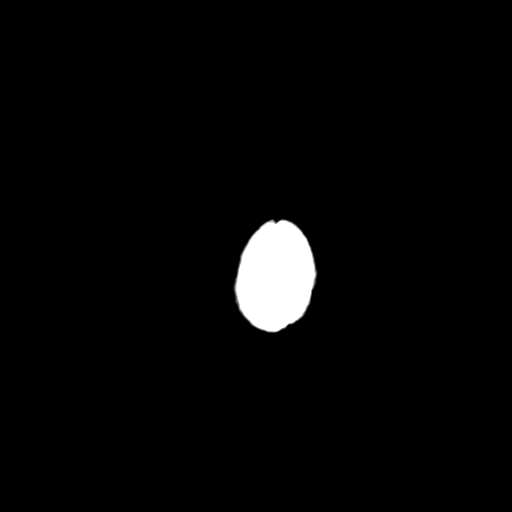
[im 32/35  bone]
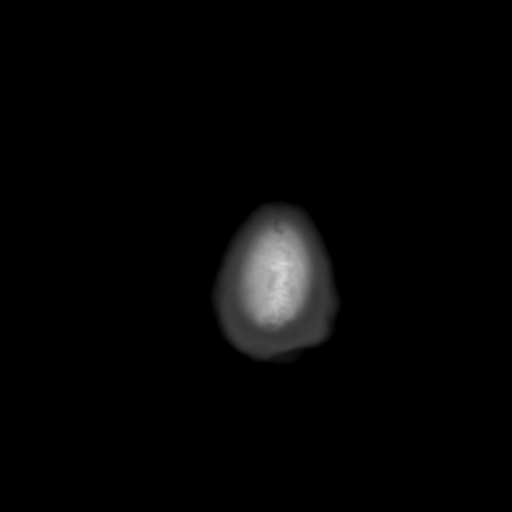

[13 of 30 positions shown; findings below may reference images not displayed]

FINDINGS: There is no evidence for mass effect, midline shift, or extra-axial fluid
collections. There is no evidence for space-occupying lesion, intracranial
hemorrhage, or cortical-based area of infarction. Minimal patchy subcortical
hypoattenuation is likely sequela of chronic microangiopathy.

The osseous structures are unremarkable.
IMPRESSION: No acute intracranial process.

[REDACTED]

## 2014-06-07 ENCOUNTER — Ambulatory Visit: Payer: Self-pay | Admitting: Oncology

## 2014-08-24 NOTE — Op Note (Signed)
PATIENT NAME:  [name] [name], [name] [name] MR#:  [id number] DATE OF BIRTH:  Nov 29, 1955  DATE OF PROCEDURE:  05/02/2012  PREOPERATIVE DIAGNOSIS: Internal derangement of left knee with history of gout.   POSTOPERATIVE DIAGNOSIS:  1.  Tear of anterior and posterior horn, medial meniscus, left knee.  2.  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   PROCEDURE PERFORMED:  1.  Left knee arthroscopy. 2.  Partial medial menisectomy. 3.  Chondroplasty of medial and patellofemoral articulations.   SURGEON: Laurice Record. Holley Bouche., MD.   ANESTHESIA: General.   ESTIMATED BLOOD LOSS: Minimal.   TOURNIQUET TIME: Not used.   DRAINS: None.   INDICATION FOR SURGERY: The patient is a 59 year old male with history of gout, who  sustained an injury to the left knee with swelling and medial joint line pain. MRI demonstrated findings consistent with meniscal pathology. After discussion of the risks and benefits of surgical intervention, the patient expressed his understanding of the risks, benefits, and agreed with plans for surgical intervention.   PROCEDURE IN DETAIL: The patient was brought to the Operating Room and, after adequate general anesthesia was achieved, a tourniquet was placed on the patient's left thigh and leg was placed in a leg holder. All bony prominences were well padded. The patient's left knee and leg were cleaned and prepped with alcohol and DuraPrep draped in the usual sterile fashion. A "timeout" was performed, as per usual protocol. The anticipated portal sites were injected with 0.25% Marcaine with epinephrine. An anterolateral portal was created and a cannula was inserted. The scope was inserted and the knee was distended with fluid using the Stryker pump. The scope was advanced down the medial gutter into the medial compartment of the knee. Under visualization with the scope, an anteromedial portal was created and a ______ probe was inserted. Inspection of  the medial compartment demonstrated crystalline deposits along the meniscus, as well as along the articular surface. Attempt was made to debride some of these and suction them from the compartment. There was a complex tear with crystalline deposit noted along the posterior horn of the medial meniscus. This was debrided using meniscal punches and a 4.5 mm shaver. Contouring of the site was then performed using the 50-degree ArthroCare wand. There was also noted to be a complex tear involving the anterior horn of the medial meniscus, which was fairly localized towards the insertion site. The tear was debrided using the 4.5 mm shaver with debriding and contouring performed using the ArthroCare wand. The remaining rim of meniscus was visualized and probed and felt to be stable. The articular surface to the medial femoral condyle was somewhat soft. Grade III chondromalacia was noted to the medial femoral condyle and the areas were debrided and contoured using the 50-degree ArthroCare wand. The scope was then advanced into the intercondylar region. The anterior cruciate ligament was visualized and probed and felt to be stable. The scope was removed from the anterolateral portal and reinserted via the anteromedial portal, so as to better visualize the lateral compartment. The articular surface was in good condition. There were some crystalline deposits noted to the lateral compartment. No significant chondral lesions were appreciated laterally. The lateral meniscus was visualized and probed and felt to be stable. Finally, the scope was positioned so as to visualize the patellofemoral articulation. Grade 3 chondral changes were noted to both the patellar facets, as well as along the sulcus of the femur. These areas were debrided and contoured using the wand. Good  patellar tracking was appreciated.   The knee was irrigated with copious amounts of fluid and then suctioned dry. The anterolateral portal was reapproximated  using 3-0 nylon. A combination of 0.5% Marcaine with epinephrine and 4 mg morphine was injected via the scope. The scope was removed and the anteromedial portal was reapproximated using 3-0 nylon. Sterile dressing was applied followed by application of ice wrap. The patient tolerated the procedure well. He was transported to the recovery room in stable condition.    ____________________________ Laurice Record. Holley Bouche., MD jph:eg D: 05/02/2012 11:53:46 ET T: 05/02/2012 23:02:14 ET JOB#: [id number]  cc: Laurice Record. Holley Bouche., MD, <Dictator> Jordie P Holley Bouche MD ELECTRONICALLY SIGNED 05/04/2012 20:35

## 2014-08-24 NOTE — Discharge Summary (Signed)
PATIENT NAME:  [name] [name], [name] [name] MR#:  [id number] DATE OF BIRTH:  04/15/1956  DATE OF ADMISSION:  04/19/2012 DATE OF DISCHARGE:  04/21/2012  DISCHARGE DIAGNOSES:  Vasovagal syncope, severe hypokalemia, hypomagnesemia, history of chronic lymphocytic leukemia, acute on chronic gout. The patient has other diagnosis including gastroesophageal reflux disease.   DISCHARGE MEDICATIONS:  Prilosec 20 mg p.o. daily, Ambien 10 mg daily, Indocin SR 75 mg extended release 1 tablet daily, vitamin B12 one tablet daily, acetaminophen with hydrocodone 5/325, one to two tablets q.6 hours p.r.n. for pain, Neurontin 100 mg in the morning and two at night, magnesium 400 mg p.o. every 8 hours, prednisone dose tapering as directed, advised to stop the lisinopril and hydrochlorothiazide because of syncope and hypokalemia.  FOLLOWUP:  Follow up with Dr. Cristi Loron regarding his gout and also follow up with Dr. Oliva Bustard as scheduled.   HOSPITAL COURSE:   1.  The patient is a 59 year old male with history of CLL, GERD and gout admitted on the 14th because of syncope and loss of consciousness. Look at the history and physical for full details. The patient felt dizzy and fell to the floor for a brief loss of consciousness and he did not have any seizure activity. The patient was evaluated in the Emergency Room and admitted for syncope. The patient's lab data on admission showed magnesium was very low at 0.7 and potassium also was low at 2.9. His anion gap was 19, so he was admitted to telemetry, started on IV fluids with potassium and also magnesium was replaced. The patient continued to do better. His potassium improved and magnesium still stayed low and the patient's magnesium was also replaced. The patient's repeat magnesium this morning 1.7. The patient's potassium also improved to 3.4. His hydrochlorothiazide was stopped because of his vasovagal syncope and he is on _____ only. The patient did have orthostatic changes in his  blood pressure yesterday. While lying down 151/102, sitting 147/92 and standing it was 149/93, but the day before, it was sitting 127/80 and then standing 107/69. So, patient did get some fluids and potassium replaced, magnesium was replaced. Advised him not to take hydrochlorothiazide but take only lisinopril for the blood pressure and advised to take KCl for 2 or 3 days and then stop it because he is not on hydrochlorothiazide. I asked him to follow up with repeat blood work with Dr. Oliva Bustard, like a BMP in a week.  2.  Acute gout. The patient follows up with Dr. Cristi Loron. He is taking Indocin on a regular basis, but he is [place], so he is not on any maintenance regimen. He has an acute flare on the right knee with swelling, pain. The patient started on high-dose steroids, Solu-Medrol. The patient felt 100% better today. The pain is gone in the knee and the swelling also improved. The patient will be going with tapering course of prednisone and he can continue Indocin. The patient had ultrasound of the right leg because he complained of pain in the leg. Even though it is concerning for acute gout only, but because he wanted to make sure it had no clot, we did a DVT study and the patient did not have any blood clot in the right leg. The patient felt much better today. His WBC is within normal limits, troponins are negative, and his neurological exam is within normal limits, so we discharged the patient home in stable condition.   Time spent on discharge preparation:  More than 30 minutes.     ____________________________ Epifanio Lesches, MD sk:es D: 04/21/2012 17:40:24 ET T: 04/22/2012 15:48:46 ET JOB#: [id number]  cc: Epifanio Lesches, MD, <Dictator> Epifanio Lesches MD ELECTRONICALLY SIGNED 04/27/2012 13:15

## 2014-08-24 NOTE — H&P (Signed)
PATIENT NAME:  [name] [name], [name] [name] MR#:  [id number] DATE OF BIRTH:  02/26/1956  DATE OF ADMISSION:  04/19/2012  PRIMARY CARE PHYSICIAN: Forest Gleason, MD  REFERRING PHYSICIAN: Pollie Friar, MD  CHIEF COMPLAINT: Loss of consciousness.   HISTORY OF PRESENT ILLNESS: [name] [name]  is a 59 year old pleasant Caucasian male with history of chronic lymphocytic leukemia. The patient was in his usual state of health until about a month ago when he had an episode of loss of consciousness that occurred while standing up making a prayer for his son. He got dizziness and then he feel to the floor with brief loss of consciousness without any seizure activity noted. The patient attributed that to probably emotional cause. However, last evening the patient was brought to the Emergency Department by his wife stating that when he stood up after a few minutes. He also developed dizziness and then he fell to the floor with brief loss of consciousness. Again, no seizure activity noted, but he was pale looking for a brief period of time and then he improved. Evaluation in the Emergency Department failed to have any orthostatic blood pressure changes. However, due to recurrence of his syncope and presence of abnormal electrolytes, the patient was admitted to the hospital for 24-hour observation over telemetry. The patient right now does not have any complaint.   REVIEW OF SYSTEMS: CONSTITUTIONAL: Denies any fever. No chills. No fatigue. EYES: No blurring of vision. No double vision. ENT: No hearing impairment. No sore throat. No dysphagia. No dizziness now, but he had dizziness prior to his syncope. CARDIOVASCULAR: No chest pain. No shortness of breath. No edema. But he had the syncope as stated above. RESPIRATORY: No shortness of breath. No cough. No chest pain. GASTROINTESTINAL: No abdominal pain. No vomiting. No diarrhea. GENITOURINARY: No dysuria. No frequency of urination. MUSCULOSKELETAL: No joint pain or swelling. No muscular pain  or swelling. INTEGUMENTARY: No skin rash. No ulcers. NEUROLOGY: No focal weakness. No seizure activity. No headache. PSYCHIATRY: He has history of anxiety. He is not depressed. ENDOCRINE: No polyuria or polydipsia. No heat or cold intolerance. HEMATOLOGY: No easy bruisability, but he has history of enlarged lymph nodes in association with his CLL.    PAST MEDICAL HISTORY:  1. 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  2. Gout. 3. Systemic hypertension.  4. Gastroesophageal reflux disease.  5. Kidney stones.  6. Heart murmur. 7. History of MRSA in 2009. 8. Depression and anxiety.   PAST SURGICAL HISTORY:  1. Tonsillectomy. 2. Adenoidectomy. 3. He had colonoscopy in September 2013 showing diverticulosis and small two polyps measuring 2 and 4 millimeter.  4. History of left knee surgery in 2011.   FAMILY HISTORY: Both parents are deceased. His father suffered from bladder cancer and his mother suffered from colon cancer and she died from complications of stroke.   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 He is married, living with his wife, and he has three children.   SOCIAL HABITS: Nonsmoker. No history of drug abuse, however, he drinks alcohol about 5 drinks of vodka every day. The patient indicates that he is trying to cut down.   ADMISSION MEDICATIONS:  1. Multivitamin once a day. 2. Prilosec 20 mg a day. 3. Potassium once a day. He states that his primary care physician told him to increase it; however, he ran out of this medication and he did not take potassium for a while.  4. Lisinopril with hydrochlorothiazide 10/12.5 mg once a day.  5. Gabapentin 100 mg in the morning and 2 at bedtime. 6. Indocin slow release 75 mg once a day. 7. Ambien 10 mg a day.  8. Colchicine p.r.n. for gout.  9. Acetaminophen p.r.n.  ALLERGIES: ALLOPURINOL CAUSING SKIN RASH. PENICILLIN CAUSING SWELLING. SULFA DRUGS - REPORTED TO BE ALLERGIC TO  IT.   PHYSICAL EXAMINATION:   VITAL SIGNS: Blood pressure 118/77, respiratory rate 18, pulse 88 and temperature 98.1.   GENERAL APPEARANCE: Middle-aged male lying in bed in no acute distress, healthy looking.   HEAD AND NECK: No pallor. No icterus. No cyanosis.   ENT: Normal hearing. No discharge. No lesions. Nasal mucosa was normal without lesions. No discharge. Oropharyngeal area showed no ulcers, no lesions and no oral thrush.   EYES: Normal eyelids and conjunctiva. Pupils about 4 millimeter, equal, round and reactive to light.   NECK: Supple. Trachea at midline. No thyromegaly. No cervical lymphadenopathy. I did not feel any lymph nodes   HEART: Normal S1, S2. No S3, S4. There is a grade 1 early systolic murmur, at the aortic area. No carotid bruits.   LUNGS: Normal breathing pattern without use of accessory muscles. No rales. No wheezing.   ABDOMEN: Soft without tenderness. No hepatosplenomegaly. No masses. No hernias.   SKIN: No ulcers. No subcutaneous nodules.   MUSCULOSKELETAL: No joint swelling. No clubbing.  NEURO: Cranial nerves II through XII are intact. No focal motor deficit.   PSYCHIATRIC: The patient is alert and oriented x3. Mood and affect were normal, although he looks slightly anxious.  LABORATORY FINDINGS: EKG showed normal sinus rhythm at rate of 80 per minute. Unremarkable EKG.  Serum glucose 88, BUN 4, creatinine 0.7, sodium 138 and potassium 2.9. His anion gap is noted to be slightly elevated at 19. Calcium was slightly low at 7.6; however, his albumin is 2.7. Magnesium was low at 0.7. Total protein 5.9. Bilirubin was normal at 0.5. Alkaline phosphatase elevated to 138. AST is 97.  ALT 41. CPK 37. Troponin less than 0.02. CBC showed white count of 5000, hemoglobin 12, hematocrit 34 and platelet count 362.   ASSESSMENT:  1. Syncope, for work-up.  2. Significant hypokalemia.  3. Significant hypomagnesemia.  4. Hypocalcemia however it is appropriate to the  albumin level and the calculated calcium is normal at 8.6.  5. Chronic lymphocytic leukemia.  6. Chronic elevation of liver enzymes attributed to the combination of fatty liver infiltration and alcoholism. Of note, the patient had ultrasound of the liver done or mentioned by Dr. Oliva Bustard in his note in September of this year.  7. Gout. 8. Hypertension. 9. Kidney stones.  10. Anxiety.  11. Gastroesophageal reflux disease.  12. Heart murmur.   PLAN: We will admit for observation over telemetry. We will monitor for any arrhythmias. Meanwhile, we will correct the electrolyte abnormalities. The patient received a couple of doses of potassium intravenously. I will continue IV hydration with normal saline and potassium supplementation. For hypomagnesemia, he received total of 2 grams intravenously and I will             start him also on oral magnesium. I advised the patient to quit alcoholism given his underlying medical problems. I will continue his home medications, as listed above.   TIME SPENT: In evaluating this patient took more than 55 minutes. ____________________________ Clovis Pu. Lenore Manner, MD amd:slb D: 04/19/2012 01:03:26 ET T: 04/19/2012 08:50:43 ET JOB#: [id number]  cc: Clovis Pu. Lenore Manner, MD, <Dictator> Martie Lee. Oliva Bustard, MD Mike Craze Irven Coe MD ELECTRONICALLY  SIGNED 04/20/2012 22:36

## 2014-08-27 NOTE — Consult Note (Signed)
PATIENT NAME:  [name] [name], [name] [name] MR#:  [id number] DATE OF BIRTH:  1955/10/01  DATE OF CONSULTATION:  04/18/2013  CONSULTING PHYSICIAN:  Huyen Perazzo P. Benjie Karvonen, MD  PRIMARY CARE PHYSICIAN:  Dr. Army Melia  PRIMARY RHEUMATOLOGIST:  Dr. Jefm Bryant.  REFERRING PHYSICIAN:  Dr. Cephus Shelling   REASON FOR CONSULTATION: Gout flare.   IMPRESSION: 1.  Gout of the right knee with, per patient's report, ALLERGIES TO MANY GOUT MEDICATIONS, except for prednisone.  2.  History of chronic lymphocytic leukemia in remission.  3.  History of hypertension.  4.  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  5.  History of neuropathy.   PLAN: 1.  The patient says he is allergic to all medications and can only take prednisone, so I recommend starting prednisone 50 mg daily for 5 days, then may stop. If there are further symptoms, may consider consultation with Dr. Precious Reel.  Would also check a uric acid level.  2.  Continue all other current medications as you are.  3.  I did speak with the patient about stopping alcohol, as gout flare is likely exacerbation due to his drinking.    HISTORY OF PRESENT ILLNESS:   This is a very pleasant 59 year old male who was admitted to psychiatry secondary to alcohol detox. Hospitalist was consulted basically for gout flare. The patient is complaining of right knee pain and inability to walk secondary to gout flare. He says he is unable to take any other medications due to multiple allergies. He does see Dr. Precious Reel regarding his gout.   REVIEW OF SYSTEMS:  CONSTITUTIONAL: No fever, fatigue, weakness. He has pain up from his gout.   EYES: No blurred or double vision, glaucoma or cataracts.  ENT: No ear pain or hearing loss or seasonal allergies.   RESPIRATORY: No cough, wheezing, of hemoptysis, dyspnea, asthma.  CARDIOVASCULAR: No chest pain, orthopnea, edema, arrhythmia, dyspnea on exertion, palpitations or syncope.  GASTROINTESTINAL: No nausea, vomiting, diarrhea, abdominal pain,  melena.  GENITOURINARY:  No dysuria or hematuria.  ENDOCRINE: No polyuria or polydipsia.  HEMOLYMPHATIC:  No easy bruising, bleeding or swollen glands.  SKIN:  No rash or lesions.  MUSCULOSKELETAL: He has limited activity due to the gout flare.  NEUROLOGIC:  No n of CVA or TIA.  PSYCHIATRIC:  No mention of depression and anxiety.   PAST MEDICAL HISTORY: 1.  Hypertension.  2.  GERD.  3.  CLL. 4.  Neuropathy from chemotherapy.  5.  Gout.  6.  Complex partial seizures.    ALLERGIES: ALLOPURINOL, PENICILLIN, SULFA, ULORIC.  MEDICATIONS: 1.  Celexa 20 mg daily.  2.  Neurontin 600 mg t.i.d.  3.  Lisinopril 20 mg daily.  4.  Metoprolol 25 mg b.i.d.  5.  Potassium 20 mEq daily.  6.  Prilosec 20 mg daily.  7.  Vitamin B12 1000 mcg injection monthly.  8.  Colchicine as needed.   SOCIAL HISTORY: The patient denies any tobacco use. He drinks a pint of vodka daily.   FAMILY HISTORY: Positive for depression.   PAST SURGICAL HISTORY: 1.  Tonsillectomy.  2.  Adenoidectomy. 3.  Polyp removed from colon.  3.  Knee surgery.   PHYSICAL EXAMINATION: VITAL SIGNS: Temperature is 97.9, pulse 112, respirations 20, blood pressure 126/87.  GENERAL: The patient is alert, oriented, not in acute distress.  HEENT: Head is atraumatic. Pupils are round. Sclerae anicteric. Mucous membranes are moist.  OROPHARYNX: Clear.  NECK: Supple without JVD, carotid bruit or enlarged thyroid.  CARDIOVASCULAR: Regular rate and  rhythm. No murmurs, gallops, or rubs. PMI is hard to palpate.  LUNGS: Clear to auscultation without crackles, rales, rhonchi, or wheezing. Normal percussion. Normal chest expansion.  ABDOMEN: Bowel sounds are positive. Nontender, nondistended. No hepatosplenomegaly.  EXTREMITIES: No clubbing, cyanosis or edema. His right knee is tender to touch and is warm. He has decreased range of motion in the right knee.  MUSCULOSKELETAL: He has decreased range of motion in the right knee due to gout  flare but no other abnormalities. No pathology to digits or nails.  NEUROLOGIC:  Cranial nerves II through XII are grossly intact. There are no focal deficits.  SKIN: Without rash or lesions.   LABORATORY, DIAGNOSTIC AND RADIOLOGICAL DATA: On admission white blood cells 6.5, hemoglobin 13.4, hematocrit 40, platelets of 111. Sodium 138, potassium 4.2, chloride 100, bicarbonate 25, BUN 14, creatinine 0.73, glucose 91, ALT 65, AST 122 total protein 6.7, albumin 3.4. Alcohol level was 306. Tylenol less than 2 and aspirin less than 1.7.   Thank you for allowing [place] to participate in the care of this patient. If there are further questions, please feel free to contact the hospitalist service.    TIME SPENT ON THIS CONSULT: 40 minutes   ____________________________ Keyah Blizard P. Benjie Karvonen, MD spm:dp D: 04/18/2013 13:17:29 ET T: 04/18/2013 15:04:14 ET JOB#: [id number]  cc: Mahagony Grieb P. Benjie Karvonen, MD, <Dictator> Donell Beers Kyliegh Jester MD ELECTRONICALLY SIGNED 04/18/2013 16:45

## 2014-08-27 NOTE — Discharge Summary (Signed)
PATIENT NAME:  [name] [name], [name] [name] MR#:  [id number] DATE OF BIRTH:  November 18, 1955  DATE OF ADMISSION:  11/09/2012 DATE OF DISCHARGE:  11/10/2012  DISCHARGING PHYSICIAN:  Gladstone Lighter, M.D.   PRIMARY CARE PHYSICIAN:  Dr. Halina Maidens.   CONSULTATIONS IN THE HOSPITAL:  Neurology consultation by Dr. Irish Elders.  DISCHARGE DIAGNOSES: 1.  New onset complex partial seizures presenting as staring spells.  2.  History of vasovagal syncope.  3.  Hypertension.  4.  Gout.  5.  Chronic lymphocytic leukemia followed by Dr. Oliva Bustard and has been in remission for the past 8 years.   6.  Peripheral neuropathy. 7.  Depression and anxiety.   DISCHARGE HOME MEDICATIONS:  1.  Prilosec 20 mg p.o. daily.  2.  Colchicine 0.6 mg daily.  3.  Gabapentin 100 mg p.o. in the morning and 200 mg at bedtime.  4.  Multivitamin 1 tablet p.o. daily.  5.  Vitamin B12, 1000 mcg injection once a month intramuscular on the 1st of every month.  6.  Oxycodone 10 mg 4 times a day as needed for pain.  7.  Lisinopril 5 mg p.o. daily.  8.  Keppra 750 mg p.o. b.i.d.   DISCHARGE DIET:  Low-sodium diet.   DISCHARGE ACTIVITY:  As tolerated.   FOLLOWUP INSTRUCTIONS:  1.   Neurology follow-up in 1 to 2 weeks.  2.  No driving until evaluated by Neurology.  No operating heavy machinery. Stay away from heights and risk of falls. I explained. 3.  PCP follow-up in 2 to 3 weeks.   LABS AND IMAGING PRIOR TO DISCHARGE: Urinalysis negative for any infection. WBC 4.3, hemoglobin 14.1, hematocrit 42.0, platelet count 253.   Sodium 142, potassium 4.0, chloride 104, bicarbonate 32, BUN 12, creatinine 0.67, glucose 87 and calcium of 8.7.   ALT 86, AST 98, alkaline phosphatase 121, total bilirubin 1.0, albumin of 3.6. Troponin remained negative while in the hospital. CT of the head without contrast showing no acute intracranial process. MRI of the brain with and without contrast showing small amount of fluid in mastoids, otherwise unremarkable.  No acute changes found. MRA of the neck showing no significant stenosis. An MRA of the brain showing a normal exam.   BRIEF HOSPITAL COURSE: [name] [name] is a 59 year old Caucasian male with past medical history significant for hypertension and admissions a couple of times for vasovagal syncope related to hypotension at that time presented to the hospital secondary to 4 episodes of staring  spells happening over the last four days prior to admission.   1. Staring spells have been for 2 to 3 minutes. The patient does not know what is going on after which he feels extremely tired and needs to sleep and after he wakes up, he is completely alert and oriented. He was also seen by Neurology for the same. Possibility of complex partial seizures is present. He is started on Keppra which is advised to be continued at this time. He will need an outpatient EEG and Neurology followup. He has not had further spells while in the hospital. No seizure, obvious seizure-like activity or incontinence has been noted during these episodes.  2.  Hypertension. Blood pressure has been stable so he is continued on his home medication lisinopril. He does not have any orthostatic hypertension this admission. 3.  The patient's course has been otherwise uneventful in the hospital. All his other home medications are being continued.   DISCHARGE CONDITION: Stable.   DISCHARGE DISPOSITION: Home.   TIME SPENT  ON DISCHARGE: 45 minutes.   ____________________________ Gladstone Lighter, MD rk:dp D: 11/11/2012 15:34:00 ET T: 11/11/2012 16:30:20 ET JOB#: [id number]  cc: Halina Maidens, MD Hemang K. Manuella Ghazi, MD Gladstone Lighter, MD, <Dictator>   Gladstone Lighter MD ELECTRONICALLY SIGNED 12/01/2012 13:23

## 2014-08-27 NOTE — Consult Note (Signed)
PATIENT NAME:  [name] [name], [name] [name] MR#:  [id number] DATE OF BIRTH:  30-Nov-1955  DATE OF CONSULTATION:  10/21/2012  REFERRING PHYSICIAN:   CONSULTING PHYSICIAN:  Emmaline Kluver., MD  REASON FOR CONSULTATION:  Gout.  HISTORY OF PRESENT ILLNESS:  This is a 59 year old white male with long history of gouty arthritis.  He has been unable to take allopurinol, Uloric or probenecid for side effect reasons. Episodically he will get acute flares of gout having to be treated with colchicine and steroids.  Until recently he would drink some alcohol regularly.  He used to have CLL, but that has been in remission.  He had right knee arthroscopy in December and had a good deal of gouty crystals. At that time, uric acid was 9. He was not given IV pegloticase because of concerns of possible risk factors and lack of tophaceous disease.  He did relatively well until recent hospitalization for transaminitis, possibly related to alcohol use and acute renal failure. His magnesium and potassium were low and those were replaced.  During that hospitalization, he had left knee pain and was given a prednisone taper. Knee improved but then both feet swelled. He came by ambulance today to the Emergency Room because he could not ambulate. He has had some analgesic with some improvement. He has not had any recurrent fever.  Says he stopped his alcohol.   PAST MEDICAL HISTORY:  Hypertension, gout, CLL, nephrolithiasis, depression, peripheral neuropathy.  PAST SURGICAL HISTORY: Tonsillectomy.  SOCIAL HISTORY: Works regularly. Daily alcohol until recently.  FAMILY HISTORY:  Negative for gout.  REVIEW OF SYSTEMS:  As above.  LABORATORY DATA: Urinalysis unremarkable. Creatinine 0.71, alkaline phosphatase 173, SGOT 69 which is improved, albumin 2.9. White count 6,800, hemoglobin 13.4, platelets 267,000.  PHYSICAL EXAMINATION:  VITAL SIGNS:  Blood pressure 168/99, respiratory rate 20, O2 sat 96. MUSCULOSKELETAL:  Neck moves well.  Hands move well. No synovitis. No tophaceous changes.  Large left knee effusion.  He has first MTP synovitis with tendosynovitis and the right foot as well as ankle synovitis with swelling and erythema and first MTP synovitis on the left.  IMPRESSION:   1.  Gouty tenosynovitis, polyarticular, following recent episode of dehydration. 2.  Intolerance of allopurinol, Uloric and probenecid. 3.  History of recent acute renal failure. 4.  History of transaminitis, probably alcohol related. 5.  History of prior chronic lymphocytic leukemia.   PLAN:   1. Procedure:  The left knee was prepped in sterile manner. Aspirated 35 mL inflammatory fluid.  Injected with 2 mL Xylocaine, 2 mL Marcaine and a mL of Kenalog.   2.  Recommend IV steroid dose followed by an oral prednisone dose.  No nonsteroidals. I will see him back in about 2 weeks to recheck his uric acid, decide on what other uric acid lowering agents can be tried.  ____________________________ Emmaline Kluver., MD gwk:sb D: 10/21/2012 13:03:38 ET T: 10/21/2012 13:41:02 ET JOB#: [id number]  cc: Emmaline Kluver., MD, <Dictator> Ovidio Hanger MD ELECTRONICALLY SIGNED 10/28/2012 19:14

## 2014-08-27 NOTE — Consult Note (Signed)
General Aspect Reason for consultation:  Syncope   Present Illness The patient is a pleasant gentleman without prior cardiac history.  He does report 3 episodes of syncope.  One in Nov, one in Dec and yesterday.  He had hypotension and dehydration and a hospitalization for this in June.  He had renal insufficiency at that time.  He however has baseline HTN and actually had Norvasc added to his meds prior to discharge following that visit.  Since that hospitalization he has been very limited by gout and was just starting to get back on his feet and walking with a walker.  He has had no orthostatic symptoms.  However, yesterday he was out shopping and it was very hot.  He was sweating.  He had a sudden syncope without and warning.  EMS was called and his initial BP was in the 60s with severe orthostasis noted in the hospital.  He denies any palpitations.  He denies any prior cardiac history or symtpoms.  He has had no chest pain or dyspnea.  He has had no leg pain or swelling.  EKG was nonacute and enzymes are negative.  Echo demonstrated no acute abormalities but there was some LAE and RAE.  RV function and pressure was otherwise normal.  SOCIAL:  Married, works.  Doesn't smoke FH  Father died with cancer (bladder), mother had cancer and died with a CVA.  No early heart disease.   Physical Exam:  GEN well developed, Some pain related to left knee gout   HEENT PERRL, moist oral mucosa   NECK supple  thyroid tender  trachea midline   RESP normal resp effort  clear BS  no use of accessory muscles   CARD Regular rate and rhythm  Normal, S1, S2  No murmur   ABD denies tenderness  denies Flank Tenderness  no liver/spleen enlargement  normal BS  no Abdominal Bruits   LYMPH negative neck, negative axillae   EXTR negative cyanosis/clubbing, negative edema, Swollen left knee   SKIN normal to palpation   NEURO motor/sensory function intact   PSYCH alert, A+O to time, place, person   Review of  Systems:  Subjective/Chief Complaint As stated in the HPI   Review of Systems: All other systems were reviewed and found to be negative     Hyperkalemia:    Alcohol Abuse:    Syncope: 19-Apr-2012   Gout:    MRSA 2009:    hx of murmur:    hypertension:    hx of kidney stones:    Depression With Anxiety:    Chemotherapy Induced Nausea:    GERD - Esophageal Reflux:    Chemotherapy Induced Hyperuricemia:    Chronic B-Cell Lymphocytic Leukemia:    Tonsillectomy and Adenoidectomy:   Lab Results: LabObservation:  28-Jun-14 13:14   OBSERVATION Reason for Test  Cardiology:  28-Jun-14 13:14   Echo Doppler REASON FOR EXAM:     COMMENTS:     PROCEDURE: [facility] - ECHO DOPPLER COMPLETE(TRANSTHOR)  - Nov 01 2012  1:14PM   RESULT: Echocardiogram Report  Patient Name:   [name] [name] Date of Exam: 11/01/2012 Medical Rec #:  [id number]             Custom1: Date of Birth:  1956-04-13          Height:       70.0 in Patient Age:    59 years           Weight:  214.0 lb Patient Gender: M                  BSA:          2.15 m??  Indications: MI Sonographer:    Janalee Dane RCS Referring Phys: Dustin Flock, H  Summary:  1. Left ventricular ejection fraction, by visual estimation, is 65 to  70%.  2. Normal global left ventricular systolic function.  3. Mild left ventricular hypertrophy.  4. Moderately dilated left atrium.  5. Moderately dilated right atrium.  6. Moderately increased left ventricular posterior wall thickness. 2D AND M-MODE MEASUREMENTS (normal ranges within parentheses): Left Ventricle:          Normal IVSd (2D):      1.26 cm (0.7-1.1) LVPWd (2D):     1.33 cm (0.7-1.1) Aorta/LA:                  Normal LVIDd (2D):     4.43 cm (3.4-5.7) Aortic Root (2D): 4.70 cm (2.4-3.7) LVIDs (2D):     2.58 cm           Left Atrium (2D): 4.70 cm (1.9-4.0) LV FS (2D):     41.8 %   (>25%) LV EF (2D):     72.9 %   (>50%)                                   Right  Ventricle:                                   RVd (2D): LV DIASTOLIC FUNCTION: MV Peak E: 0.95 m/s E/e' Ratio: 9.60 MV Peak A: 0.84 m/s Decel Time: 208 msec E/A Ratio: 1.14 SPECTRAL DOPPLER ANALYSIS (where applicable): Mitral Valve: MV P1/2 Time: 60.32 msec MV Area, PHT: 3.65 cm?? Aortic Valve: AoV Max Vel: 2.10 m/s AoV Peak PG: 17.6 mmHg AoV Mean PG:  9.0 mmHg LVOT Vmax: 1.85 m/s LVOT VTI: 0.371 m LVOT Diameter:  PHYSICIAN INTERPRETATION: Left Ventricle: The left ventricular internal cavity size was normal. LV  posterior wall thickness was moderately increased. Mild left ventricular  hypertrophy. Global LV systolic function was normal. Left ventricular  ejection fraction, by visual estimation, is 65 to 70%. Right Ventricle: The right ventricular size is normal. Left Atrium: The left atrium is moderately dilated. Right Atrium: The right atrium is moderately dilated. Mitral Valve: Trace mitral valve regurgitation is seen. Tricuspid Valve: Trivial tricuspid regurgitation is visualized. Aortic Valve: The aortic valve is normal. Aorta: The aortic root is normal in size and structure. Parkin MD Electronically signed by [id number] Isaias Cowman MD Signature Date/Time: 11/01/2012/2:13:23 PM  *** Final ***  IMPRESSION: .    Verified BySheppard Coil . PARASCHOS, M.D., MD  Routine Chem:  28-Jun-14 00:40   Glucose, Serum  100  BUN  24  Creatinine (comp) 0.96  Sodium, Serum 137  Potassium, Serum  5.2  Chloride, Serum 104  CO2, Serum 30  Calcium (Total), Serum 9.1  Anion Gap  3  Osmolality (calc) 278  eGFR (African American) >60  eGFR (Non-African American) >60 (eGFR values <83m/min/1.73 m2 may be an indication of chronic kidney disease (CKD). Calculated eGFR is useful in patients with stable renal function. The eGFR calculation will not be reliable in acutely ill patients when serum creatinine is changing rapidly. It is not useful in  patients on dialysis.  The eGFR calculation may not be applicable to patients at the low and high extremes of body sizes, pregnant women, and vegetarians.)  Cardiac:  28-Jun-14 00:40   Troponin I < 0.02 (0.00-0.05 0.05 ng/mL or less: NEGATIVE  Repeat testing in 3-6 hrs  if clinically indicated. >0.05 ng/mL: POTENTIAL  MYOCARDIAL INJURY. Repeat  testing in 3-6 hrs if  clinically indicated. NOTE: An increase or decrease  of 30% or more on serial  testing suggests a  clinically important change)  CK, Total  15  CPK-MB, Serum  < 0.5 (Result(s) reported on 01 Nov 2012 at 01:17AM.)    09:02   Troponin I < 0.02 (0.00-0.05 0.05 ng/mL or less: NEGATIVE  Repeat testing in 3-6 hrs  if clinically indicated. >0.05 ng/mL: POTENTIAL  MYOCARDIAL INJURY. Repeat  testing in 3-6 hrs if  clinically indicated. NOTE: An increase or decrease  of 30% or more on serial  testing suggests a  clinically important change)  CK, Total  14  CPK-MB, Serum  < 0.5 (Result(s) reported on 01 Nov 2012 at 09:49AM.)  Routine Hem:  28-Jun-14 00:40   WBC (CBC) 9.0  RBC (CBC)  3.99  Hemoglobin (CBC) 13.4  Hematocrit (CBC)  38.7  Platelet Count (CBC)  466  MCV 97  MCH 33.6  MCHC 34.7  RDW 13.3  Neutrophil % 67.7  Lymphocyte % 18.8  Monocyte % 11.2  Eosinophil % 0.9  Basophil % 1.4  Neutrophil # 6.1  Lymphocyte # 1.7  Monocyte # 1.0  Eosinophil # 0.1  Basophil # 0.1 (Result(s) reported on 01 Nov 2012 at 12:52AM.)   EKG:  EKG Interp. by me   Additional Comments NSR, rate 82, axis WNL, QTc borderline, no acute ST T wave changes   Radiology Results: Cardiology:    28-Jun-14 13:14, Echo Doppler  Echo Doppler   REASON FOR EXAM:      COMMENTS:       PROCEDURE: [facility] - ECHO DOPPLER COMPLETE(TRANSTHOR)  - Nov 01 2012  1:14PM     RESULT: Echocardiogram Report    Patient Name:   AMONTE BROOKOVER [name] Date of Exam: 11/01/2012  Medical Rec #:  [id number]             Custom1:  Date of Birth:  23-Nov-1955          Height:       70.0  in  Patient Age:    26 years           Weight:       214.0 lb  Patient Gender: M                  BSA:          2.15 m??    Indications: MI  Sonographer:    Janalee Dane RCS  Referring Phys: Dustin Flock, H    Summary:   1. Left ventricular ejection fraction, by visual estimation, is 65 to   70%.   2. Normal global left ventricular systolic function.   3. Mild left ventricular hypertrophy.   4. Moderately dilated left atrium.   5. Moderately dilated right atrium.   6. Moderately increased left ventricular posterior wall thickness.  2D AND M-MODE MEASUREMENTS (normal ranges within parentheses):  Left Ventricle:          Normal  IVSd (2D):      1.26 cm (0.7-1.1)  LVPWd (2D):     1.33 cm (0.7-1.1) Aorta/LA:  Normal  LVIDd (2D):     4.43 cm (3.4-5.7) Aortic Root (2D): 4.70 cm (2.4-3.7)  LVIDs (2D):     2.58 cm           Left Atrium (2D): 4.70 cm (1.9-4.0)  LV FS (2D):     41.8 %   (>25%)  LV EF (2D):     72.9 %   (>50%)                                    Right Ventricle:                                    RVd (2D):  LV DIASTOLIC FUNCTION:  MV Peak E: 0.95 m/s E/e' Ratio: 9.60  MV Peak A: 0.84 m/s Decel Time: 208 msec  E/A Ratio: 1.14  SPECTRAL DOPPLER ANALYSIS (where applicable):  Mitral Valve:  MV P1/2 Time: 60.32 msec  MV Area, PHT: 3.65 cm??  Aortic Valve: AoV Max Vel: 2.10 m/s AoV Peak PG: 17.6 mmHg AoV Mean PG:   9.0 mmHg  LVOT Vmax: 1.85 m/s LVOT VTI: 0.371 m LVOT Diameter:    PHYSICIAN INTERPRETATION:  Left Ventricle: The left ventricular internal cavity size was normal. LV   posterior wall thickness was moderately increased. Mild left ventricular   hypertrophy. Global LV systolic function was normal. Left ventricular   ejection fraction, by visual estimation, is 65 to 70%.  Right Ventricle: The right ventricular size is normal.  Left Atrium: The left atrium is moderately dilated.  Right Atrium: The right atrium is moderately dilated.  Mitral  Valve: Trace mitral valve regurgitation is seen.  Tricuspid Valve: Trivial tricuspid regurgitation is visualized.  Aortic Valve: The aortic valve is normal.  Aorta: The aortic root is normal in size and structure.  Angus MD  Electronically signed by [id number] Isaias Cowman MD  Signature Date/Time: 11/01/2012/2:13:23 PM    *** Final ***    IMPRESSION: .        Verified BySheppard Coil . PARASCHOS, M.D., MD    PCN: Swelling  Allopurinol: Rash  Other- Explain in Comments Line: Rash  Sulfa drugs: Unknown  Uloric: Other  Vital Signs/Nurse's Notes: **Vital Signs.:   28-Jun-14 14:51  Vital Signs Type Routine  Temperature Temperature (F) 98.3  Celsius 36.8  Temperature Source oral  Pulse Pulse 70  Respirations Respirations 20  Systolic BP Systolic BP 960  Diastolic BP (mmHg) Diastolic BP (mmHg) 454  Mean BP 098  Systolic BP Systolic BP 119  Diastolic BP (mmHg) Diastolic BP (mmHg) 99  Pulse Lying Pulse Lying 66  Systolic BP Systolic BP 147  Diastolic BP (mmHg) Diastolic BP (mmHg) 97  Pulse Pulse Sitting 73  Systolic BP Systolic BP 829  Diastolic BP (mmHg) Diastolic BP (mmHg) 98  Pulse Standing Pulse Standing 81  Pulse Ox % Pulse Ox % 97  Pulse Ox Activity Level  At rest  Oxygen Delivery Room Air/ 21 %  *Intake and Output.:   Shift 28-Jun-14 15:00  Grand Totals Intake:  480 Output:  2300    Net:  -1820 24 Hr.:  -1820  Oral Intake      In:  480  Urine ml     Out:  2300  Length of Stay Totals Intake:  480 Output:  3700    Net:  -  [place]:  This is secondary to an orthostatic BP drop.  At this point he will be off of his HTN meds.  We will need to tolerate elevated SBP as an out patient for now and only slowly start back his BP meds (perhaps if SBP is consistently above 160 as an outpatient.)  At this point I do not think that there is an indication for midodrine or volume expansion because of the resting HTN.  He should wear the knee high  compression stockings.  I would like for him to follow in our clinic because of the recurrent nature of these events.  Unfortunately, I suggested that he not go on a cruise that he had planned for tomorrow.  I don't think that he would enjoy this with his acute gout episode ongoing.   HTN:  As above.   Electronic Signatures: Minus Breeding (MD)  (Signed 28-Jun-14 16:03)  Authored: General Aspect/Present Illness, History and Physical Exam, Review of System, Past Medical History, Labs, EKG , Radiology, Allergies, Vital Signs/Nurse's Notes, Impression/Plan   Last Updated: 28-Jun-14 16:03 by Minus Breeding (MD)
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
## 2014-08-27 NOTE — H&P (Signed)
PATIENT NAME:  [name] [name], [name] MR#:  [id number] DATE OF BIRTH:  Dec 07, 1955  DATE OF ADMISSION:  10/31/2012  PRIMARY CARE PROVIDER: Dr. Army Melia   ED DOCTOR:  Dr. Thomasene Lot.   CHIEF COMPLAINT: Syncope.   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 He is in remission. Has a history of hypertension and gout, who was recently hospitalized on June 11. At that time, he presented with dizziness. He was noted to have hypotension, with blood pressures in the 80s. He was also noticed to have acute renal failure. The patient was admitted and hydrated, his antihypertensives held, and then he was discharged home. When he was discharged, his blood pressure started increasing, so he was started on amlodipine in addition to the lisinopril he was already on. The patient reports that after the discharge, he continued to have severe problem with his gout, and had to stay home. He had to come to the ED last week and had to receive IV steroids. Then he was discharged home on steroids. The patient on Tuesday started having some weakness, and also was sent here for elevated potassium from his primary care's office. The patient received some medication to help decrease the potassium, and then he was discharged. The patient stayed home since discharge. Then finally today, his gout was better, so he went out to the outlets. The patient was in the outlets today standing, and all of a sudden collapsed. When EMS got there, his blood pressure was in the 60s again. The patient, when arrived initially here, again was noted to be 65/52. He received IV fluids, now his blood pressure is improved. The patient reports that he has been eating and drinking okay, had an episode of diarrhea last week, but none now. He is not nauseous. He has been trying to drink plenty of fluids. He has been taking his antihypertensive medications. He denies any chest pains or  palpitations. No syncope prior to today. Denies any diarrhea. Denies any hematemesis, hematochezia. Denies any urinary symptoms.   PAST MEDICAL HISTORY:  1.  Significant history of CLL, followed by Dr. Oliva Bustard. 2.  History of gout, with recent flares.  3.  History of hypertension.  4.  GERD.   5.  History of nephrolithiasis.  6.  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  7.  Depression/anxiety.   ALLERGIES:  ALLOPURINOL, PENICILLIN, SULFA, ULORIC.   PAST SURGICAL HISTORY: Status post tonsillectomy and adenoidectomy, polyp removal from his colon, history of knee surgery x 2.   FAMILY HISTORY:  Father had bladder cancer, mother had colon cancer and a stroke.   CURRENT MEDICATIONS AT HOME:  Amlodipine 10 daily. Colace 100, 1 tab p.o. b.i.d. Colchicine 0.6 daily. Gabapentin 300, 1 tab p.o. t.i.d. Lisinopril 20 daily. Magnesium oxide 400, 1 tab p.o. b.i.d. Multivitamin 1 tab p.o. daily. Prilosec 20, 1 tab p.o. daily. Vitamin B12 injection q. monthly.    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 Used to drink 5 to 6 vodka drinks a day until he was hospitalized. He states that now he drinks 1 glass of wine, and has cut down significantly. He denies any drug use.   REVIEW OF SYSTEMS:   CONSTITUTIONAL: Denies any fevers. Complains of fatigue, weakness today. No pain. No weight loss. No weight gain.  EYES: No blurred or double vision. No pain. No redness. No inflammation. No glaucoma. No  cataracts.  EARS, NOSE, THROAT:  No tinnitus. No ear pain. No hearing loss. No seasonal or year-round allergies. No difficulty swallowing.  RESPIRATORY:  Denies any cough, wheezing, hemoptysis. No COPD. No TB.   CARDIOVASCULAR:  Denies any chest pain, orthopnea, edema. No arrhythmia. No dyspnea on exertion.  GASTROINTESTINAL: No nausea, vomiting, diarrhea. No abdominal pain. No hematemesis. No melena. No ulcer. No GERD. No IBS. No jaundice.  GENITOURINARY: Denies any  dysuria, hematuria, renal calculus or frequency. ENDOCRINE:  Denies any polyuria, nocturia or thyroid problems.  HEMATOLOGIC/LYMPHATIC: Denies any anemia, easy bruisability or bleeding.  SKIN:  No acne. No rash. No changes in mole, hair or skin.  MUSCULOSKELETAL:  Has history of gout. His gout flare in the foot is much better.  NEUROLOGIC:  No numbness. No CVA. No TIA.  PSYCHIATRIC:  No anxiety. No insomnia. No ADD.   PHYSICAL EXAMINATION: VITAL SIGNS: Temperature 98.4, pulse 85, respirations 18, blood pressure on arrival was 65/52, currently 131/77.  GENERAL:  The patient is a well-developed, well-nourished male in no acute distress.  HEENT: Head atraumatic, normocephalic. Pupils equally round, reactive to light and accommodation. There is no conjunctival pallor. No scleral icterus. Extraocular movements intact. Nose:  There is no nasal drainage or lesions. Ears: No drainage or erythema. Mouth: No exudates.  NECK:  Supple and symmetric. No masses. Thyroid midline, not enlarged. No JVD.  RESPIRATORY: Good respiratory effort. Clear to auscultation bilaterally, without any rales, rhonchi, wheezing.  CARDIOVASCULAR: Regular rate and rhythm. No murmurs, rubs, clicks or gallops. PMI is not displaced.  LUNGS:  Clear to auscultation bilaterally, without any rales, rhonchi or wheezing.  ABDOMEN: Soft, nontender, nondistended. Positive bowel sounds x 4. There is no hepatosplenomegaly.  EXTREMITIES: No clubbing, cyanosis, edema.  SKIN:  No rash.  LYMPHATICS: No lymph nodes palpable.  VASCULAR:  Good DP, PT pulses.  PSYCHIATRIC:  Not anxious or depressed.  NEUROLOGIC:  Awake, alert, oriented x 3.   LABORATORY DATA: Glucose 70, BUN 22, creatinine 1.09, sodium 141, potassium 4.3, chloride 109, CO2 is 22, calcium 7.8. LFTs: Total protein 5.9, albumin 2.9, bilirubin total 0.6, AST 40. CPK 12, CK-MB is less than 0.5. Troponin less than 0.02. WBC 9.8, hemoglobin 13.1, platelet count 528.   ASSESSMENT AND  PLAN:  The patient is a 59 year old white male who was in hospital recently with hypotension, acute renal failure, who presents with syncope, hypotension again.   1.  Syncope and collapse due to hypotension. At this time, will place him on observation on telemetry. Also get an echocardiogram of the heart. Continue IV fluids.   2.  Recurrent hypotension. At this time, will need to rule out adrenal insufficiency. Check a random cortisol. Also could have autonomic dysfunction as a result of previous alcohol abuse. Will repeat orthostatics in the morning after IV fluids. Will hold his antihypertensives.   3. Hypertension. Hold antihypertensives. 4.  Gastroesophageal reflux disease. Will continue his omeprazole as taking at home.  5.  Gout. Will continue colchicine.  6.  History of CLL. Outpatient follow up with Oncology.  7. Gout. Will continue colchicine as taking at home.  Note:  45 minutes spent on the H and P.   ____________________________ Lafonda Mosses. Posey Pronto, MD shp:mr D: 10/31/2012 20:14:40 ET T: 10/31/2012 21:21:30 ET JOB#: [id number]  cc: Abbygayle Helfand H. Posey Pronto, MD, <Dictator> Alric Seton MD ELECTRONICALLY SIGNED 11/12/2012 8:28

## 2014-08-27 NOTE — Discharge Summary (Signed)
PATIENT NAME:  [name] [name], [name] [name] MR#:  [id number] DATE OF BIRTH:  Sep 11, 1955  DATE OF ADMISSION:  10/31/2012 DATE OF DISCHARGE:  11/02/2012  REASON FOR ADMISSION: Syncope.   DISCHARGE DIAGNOSIS:  Orthostatic hypotension.   OTHER DIAGNOSES INCLUDE: 1.  History of chronic lymphocytic leukemia, in remission. 2.  Hypertension.  3.  Gout with acute exacerbation.  4.  History of neuropathy of the lower extremities.  5.  Depression and anxiety.   DISPOSITION: Home.  RECOMMENDATIONS: Do not return to work until cleared up by Cardiology as the patient has history of multiple syncopal episodes and also history of using heavy machinery.   MEDICATIONS AT DISCHARGE: Prilosec 10 mg daily, Colcrys  0.6 mg p.r.n. gout.   Continue lisinopril 5 mg once daily, OxyContin 10 mg every 4 hours as needed for pain, prednisone 20 mg taper, vitamin B12 and multivitamins.   NOTE:  Amlodipine 10 mg take 1/2 tablet a day has been stopped, so stop amlodipine.   HOSPITAL COURSE: [name] [name] is a very nice 59 year old gentleman who has history of gout, hypertension, CLL in remission, comes to the Emergency Department on 10/31/2012 with a history of a syncopal episode. Apparently, the patient was found to be hypotensive at 65/55 on arrival to the ED, received IV fluids and blood pressure improved. He has history of this problem in the past happening for 3 times and is starting to get concerned.   The patient and has a trip scheduled to go to the [place] on a cruise, and that was going to be today but that was cancelled based on medical advice. The patient continues to have this problem without any significant changes. He has been advised about what to do now.  His syncopal episode seems to be related to orthostatic hypotension as the patient had significant drop of his blood pressure more than 20 points on systolic and more of than 16 points increased on pulse whenever orthostatics are done. The patient underwent an  echocardiogram that did not show any major abnormalities. Overall, the patient was admitted for observation of this problem. His blood pressure medications were held, and the patient started to have increased high blood pressure in the 180s, 190s. Cardiology consultation was obtained by Dr. Minus Breeding from [facility], and he recommended the patient to stop blood pressure medications and maybe start him on a low dose of lisinopril today, and then discharge him home and follow up with [facility] Cardiology. The patient is not a candidate to take fludrocortisone due to his significant increase of his blood pressure, and the main things that we can do for him is educate him about orthostatic hypotension and start stockings, knee-high, and then advance to thigh-high, if necessary.   Other than that, the patient developed a gout exacerbation during this hospitalization affecting his right knee, for which he was put on steroids. He was recently put on steroids by Dr. Jefm Bryant within a week, for which we did check his cortisol stem test for a false positive.  I recommend Dr. Army Melia to arrange for an outpatient stem test.  If positive, refer to endocrinology.   The patient did well during this hospitalization. We are going to let him go home today with recommendations as far as his syncope.   TIME:   I spent about 45 minutes with this patient educating him about the process of his disease, and he understands very well. We are going to keep him off work until cleared up by Cardiology.  ____________________________ [place] Sink, MD rsg:cb D: 11/02/2012 13:27:18 ET T: 11/02/2012 20:50:49 ET JOB#: [id number]  cc:  Shores Sink, MD, <Dictator> Laquanda Bick America Brown MD ELECTRONICALLY SIGNED 11/05/2012 15:13

## 2014-08-27 NOTE — H&P (Signed)
PATIENT NAME:  [name] [name], [name] [name] MR#:  [id number] DATE OF BIRTH:  10/29/1955  DATE OF ADMISSION:  11/09/2012  PRIMARY CARE PROVIDER:  Dr. Army Melia.   CHIEF COMPLAINT:  Staring spells.   HISTORY OF PRESENT ILLNESS:  A 59 year old Caucasian male patient with history of CLL in remission, hypertension, vasovagal syncope, presents to the hospital after having four episodes of staring spells.  This started on 11/04/2012 with a staring spell of about 2 to 3 minutes.  After which patient is extremely weak and sleeps and on waking up feels fine.  These episodes have occurred 4 times and the last episode being yesterday night.  The wife has noticed these episodes.  The patient has not had any falls.  No incontinence or seizure-like activity.  No similar episodes in the past.  The patient was recently seen in the hospital, discharged on 11/02/2012 after being treated for vasovagal syncope.   Presently, the patient feels well, has been seen by tele-neurology, who recommended admission and MRA of the brain along with starting on Keppra 1000 mg twice daily.   The patient's wife did take blood pressure on two of these episodes and she mentions that the blood pressure was fine.  It was 132/90 on one of the episodes.   PAST MEDICAL HISTORY: 1.  CLL, in remission, followed by Dr. Oliva Bustard, last chemotherapy was 8 years back.  2.  Gout with recent flares.  3.  Hypertension.  4.  GERD.  5.  Nephrolithiasis.  6.  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  7.  Depression, anxiety.   ALLERGIES:  ALLOPURINOL, PENICILLIN, SULFA, ULORIC.   PAST SURGICAL HISTORY:  Tonsillectomy and adenoidectomy, polyp removal from colon and knee surgeries.   FAMILY HISTORY:  Father had bladder cancer.  Mother had colon cancer and a stroke.   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  Used to drink 5 to 6 vodka drinks until recently.  Now he drinks about a glass of wine a day.  No illicit  drug use.   REVIEW OF SYSTEMS:  CONSTITUTIONAL:  No fever, fatigue, weakness.  EYES:  No blurred vision, pain, redness.  EARS, NOSE, THROAT:  No tinnitus, ear pain, hearing loss.  RESPIRATORY:  No cough, wheeze, hemoptysis.  CARDIOVASCULAR:  No chest pain, orthopnea, edema.  GASTROINTESTINAL:  No nausea, vomiting, diarrhea, abdominal pain.  GENITOURINARY:  No dysuria, hematuria, frequency.  ENDOCRINE:  No polyuria, nocturia, thyroid problems.  HEMATOLOGIC AND LYMPHATIC:  No anemia, easy bruising, bleeding.  INTEGUMENTARY:  No acne, rash, lesions.  Does have dry skin in the palms and feet.  MUSCULOSKELETAL:  No joint swelling, redness.  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  PSYCHIATRIC:  Has anxiety, depression.   HOME MEDICATIONS: 1.  Colcrys 0.6 mg oral once a day as needed for gout.  2.  Gabapentin 100 mg oral once a day and 2 capsules at night.  3.  Lisinopril 5 mg oral once a day.  4.  Multivitamin 1 tablet oral once a day. 5.  Oxycodone 10 mg oral every 4 hours as needed for pain.  6.  Prilosec 20 mg oral once a day.  7.  Vitamin B12 1000 mcg injectable once a month.   PHYSICAL EXAMINATION: VITAL SIGNS:  Temperature 97.9, pulse of 63, blood pressure 148/76, saturating 93% on room air.  GENERAL:  Moderately built Caucasian male patient sitting up in bed, comfortable, conversational, cooperative with exam.  PSYCHIATRIC:  Alert, oriented x 3.  Mood and affect appropriate.  Judgment intact.  HEENT:  Atraumatic, normocephalic.  Oral mucosa moist and pink.  External ears and nose normal.  No pallor.  No icterus.  Pupils bilaterally equal and react to light.  NECK:  Supple.  No thyromegaly.  No palpable lymph nodes.  Trachea midline.  No carotid bruits, JVD.  CARDIOVASCULAR:  S1, S2, without any murmurs.  Peripheral pulses 2+.  No edema.  RESPIRATORY:  Normal work of breathing.  Clear to auscultation on both sides.  GASTROINTESTINAL:  Soft abdomen, nontender.  Bowel sounds  present.  No hepatosplenomegaly palpable.  SKIN:  Warm and dry.  No petechiae, rash, ulcers.  Has scaling of skin on palms and feet.  NEUROLOGICAL:  Motor strength 5 by 5 in upper and lower extremities.  Sensation  intact all over.  Cranial nerves II through XII intact.  LYMPHATIC:  No cervical, supraclavicular lymphadenopathy.   LABORATORY, DIAGNOSTIC AND RADIOLOGICAL DATA:  Glucose of 87, BUN 12, creatinine 0.67, sodium 142, potassium 4.  AST, ALT, alkaline phosphatase normal.  Troponin less than 0.02.  WBC 4.3, hemoglobin 14.1, platelets of 253.  Urinalysis shows no bacteria.   EKG shows normal sinus rhythm.  No acute ST-T wave changes.   CT scan of the head without contrast shows no acute intracranial abnormalities.   ASSESSMENT AND PLAN: 1.  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  The patient has had four episodes of these.  We will start him on Keppra as recommended by neurology.  We will also get an MRA of the brain.  We will have to look for any intracranial lesions, stroke or even lymphoma.  We will get an EEG.  The patient will be admitted on tele.  Seizure precautions.  Ativan as needed for seizures.  2.  Hypertension.  The patient's blood pressure is presently mildly elevated and he had seen his cardiologist who has recommended his blood pressure to be in the high-normal for now.  We will continue his home medications.  3.  Chronic lymphocytic leukemia, follows with Dr. Oliva Bustard as outpatient.  4.  DVT prophylaxis with Lovenox.  5.  CODE STATUS:  FULL CODE.   Time spent today on this case was 40 minutes.     ____________________________ Leia Alf Jayen Bromwell, MD srs:ea D: 11/09/2012 17:39:13 ET T: 11/09/2012 19:03:07 ET JOB#: [id number]  cc: Alveta Heimlich R. Charina Fons, MD, <Dictator> Halina Maidens, MD Martie Lee. Choksi, MD Dr. Precious Reel Neita Carp MD ELECTRONICALLY SIGNED 11/09/2012 21:29

## 2014-08-27 NOTE — H&P (Signed)
PATIENT NAME:  [name] [name], [name] [name] MR#:  [id number] DATE OF BIRTH:  [birth year]  DATE OF ADMISSION:  11/13/2012  PRIMARY CARDIOLOGIST: Ida Rogue, MD  REFERRING PHYSICIAN: Ponciano Ort, MD   CHIEF COMPLAINT: Passed out.  HISTORY OF PRESENT ILLNESS: The patient is a pleasant 59 year old Caucasian male with multiple hospitalizations in the last couple of months for dizziness, renal failure, syncope and absence seizures who was taking a shower and the next thing he knew he woke up in the shower bleeding from his nose. EMS was called. Per wife, he was a little confused when EMS was there, and now he is better. 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 He states that the [place] makes him excessively drowsy and sleepy. He has woken up during the middle of the night a couple of days in a row, doing dishes one time and last night doing the laundry, and goes back to sleep and sleeps for a few hours and then wakes up. He has not gone to work and is on short-term disability. He has followup with Dr. Rockey Situ as an outpatient, and his outpatient medications for blood pressure have been adjusted, and he is on a lower dose lisinopril for now. He denies having any pains in the chest. A CAT scan of the head is done showing no acute intracranial process except for the hematoma on the right eyebrow area. Hospitalist services were contacted for further evaluation and management.   PAST MEDICAL HISTORY: CLL in remission, followed by Dr. 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   ALLERGIES: ALLOPURINOL, PENICILLIN, SULFA AND ULORIC.   PAST SURGICAL HISTORY: Tonsillectomy, adenoidectomy, polyp removed colon and  knee surgeries.   FAMILY HISTORY: Bladder cancer with dad.  Mom had colon cancer and stroke.   SOCIAL HISTORY: He is on short-term disability from [employer].  No smoking.  He used to drink 5 to 6 vodka drinks until June 11th when he quit.  He denies alcohol currently or illicit drugs.   OUTPATIENT MEDICATIONS: Colchicine 0.6 mg once a day as needed for gout, gabapentin 100 mg in the morning and 200 mg at bedtime, Keppra 750 mg 3 times a day, lisinopril 5 mg daily, multivitamin 1 tab daily.  The patient is supposed to be on oxycodone p.r.n., but he does not take as the wife has locked it up in a closet.  Prilosec 20 mg daily, vitamin B12 1000 mcg injectable once a month.   REVIEW OF SYSTEMS:   CONSTITUTIONAL: No fever, fatigue or weakness. Has had some weight loss in the last couple of months.  EYES: No blurry vision or double vision.  ENT: No tinnitus or hearing loss. He did have some epistaxis after a syncopal episode.  RESPIRATORY: No cough, shortness of breath, wheezing.  CARDIOVASCULAR: Denies chest pain, swelling in the legs or arrhythmias. No palpitations or butterfly sensations in the chest.  GASTROINTESTINAL: No nausea, vomiting. Did have several bouts of diarrhea from Sunday to Tuesday, up to 5 episodes daily, now stool is formed. No abdominal pain.  GENITOURINARY: Denies dysuria or hematuria.  HEMATOLOGIC/LYMPHATIC: Denies anemia or easy bruising.  SKIN: No rashes.  MUSCULOSKELETAL: Has a history of gout. NEUROLOGICAL: Recent absence seizures.  PSYCHIATRIC: No anxiety or depression currently.   PHYSICAL  EXAMINATION: VITAL SIGNS: Temperature 97.4, pulse rate 71, respiratory rate 18, blood pressure 115/69, O2  sat 94% on room air.  On orthostatic vitals, heart rate when lying is 77, blood pressure 120/80.  When he stands up, heart rate is 105, blood pressure 105/71.  GENERAL: The patient is a well-developed Caucasian male lying in bed in no obvious distress, a little anxious.  HEENT:   The patient has a slight hematoma in the right eyebrow region and slight abrasions on the nose. Pupils are equal and reactive. Anicteric sclerae. Extraocular muscles intact. Moist mucous membranes.  NECK: Supple. No thyroid tenderness. No cervical lymphadenopathy.  CARDIOVASCULAR: S1, S2 regularly irregular. There is also a systolic murmur in the aortic region.   ABDOMEN: Soft, nontender, nondistended. Positive bowel sounds in all quadrants.  EXTREMITIES: No significant lower extremity edema.  LUNGS: Clear to auscultation without wheezing, rhonchi or rales.  SKIN: No obvious rashes.  NEUROLOGIC: Cranial nerves II through XII appear grossly intact. Strength is 5 out of 5 in all extremities. Sensation is intact to light touch.  PSYCHIATRIC: Awake, alert, oriented x 3, cooperative.    LABORATORY AND RADIOLOGICAL DATA:  Last BUN 17, creatinine 0.59, glucose 70, sodium 140, potassium 4.4. WBC 5.1, hemoglobin 13.8, platelets 146.  EKG: Normal sinus rhythm. There are some Q waves in VI to V3 which I did not see in previous EKGs. No acute ST elevations or depressions. CAT scan of the head as above.   ASSESSMENT AND PLAN: We have a pleasant 59 year old male with recent hospitalizations who was just discharged on Keppra for partial complex/absence seizures who has had extensive workup including MRI, CAT scans, echoes, Neurology as well as Cardiology eval, with persistent syncope. The cause of syncope at this point is unclear, but given the multiple episodes and no cause thus far, arrhythmias are suspected. We will admit the patient to the hospital with a monitored bed to watch for potential arrhythmias, and perhaps he should be discharged with scheduling of a Holter monitor for long-term monitoring. Another scenario might be seizures where he was found with some confusion post syncope. He has had no EEG, and I would order that. Alternatively, orthostasis might be also a contributing factor as he was in a hot  shower, and he is orthostatic here per heart rate. Would obtain a cardiology consult to evaluate for potential arrhythmias and order an EEG. A tilt table test should be done as an outpatient, and also obtain a neurology consult, as the patient is excessively drowsy and sleepy on Keppra to see if, perhaps, we can switch it to something else. He is orthostatic, and I will hold ACE inhibitor and start the patient on some IV fluids and recheck orthostatic vitals b.i.d. He does appear to have new Q waves on EKG but has no chest pains and negative troponin. Cardiology will be consulted. I would cycle the troponins and CK-MB at this point. He is not taking oxycodone and he is not drinking alcohol at this point, so I think those are not contributing factors. There has been no recent medication except for the Keppra, and he was having syncope prior to that. At this point, further work-up depending on hospital course.   CODE STATUS:  The patient is a FULL CODE.      TOTAL TIME SPENT: 50 minutes.    ____________________________ Vivien Presto, MD sa:cb D: 11/13/2012 19:18:58 ET T: 11/13/2012 20:24:07 ET JOB#: [id number]  cc: Vivien Presto, MD, <Dictator> Minna Merritts, MD [facility]  [facility] MD ELECTRONICALLY SIGNED 12/01/2012 14:34

## 2014-08-27 NOTE — Consult Note (Signed)
PCN: Swelling  Allopurinol: Rash  Other- Explain in Comments Line: Rash  Sulfa drugs: Unknown  Uloric: Other   Impression 1 gout falre hx gout hx CLL 3, EtOh abuse 4. HTN 5,. neuropathy   Plan 1. pt says he cannot take any meds for gout except steroids start prednsione 50 mg po daily for 5 days  if sympotms persisit consider consutl with dr Norvel Richards his rheumotologist 2. cont other meds as you are  3. check uric acid  thank you  call if there are further questions  [phone]   Electronic Signatures: Bettey Costa (MD)  (Signed 13-Dec-14 13:10)  Authored: Allergies, Impression/Plan   Last Updated: 13-Dec-14 13:10 by Bettey Costa (MD)

## 2014-08-27 NOTE — Discharge Summary (Signed)
PATIENT NAME:  [name] [name], [name] [name] MR#:  [id number] DATE OF BIRTH:  1955/06/07  DATE OF ADMISSION:  10/15/2012 DATE OF DISCHARGE:  10/18/2012  DISCHARGE DIAGNOSES: 1.  Acute renal failure, improved.  2.  Elevated liver function tests, coming down.  3.  Hypotension on presentation, due to dehydration, improved.  4.  Hypertension.  5.  Hypomagnesemia.   CONDITION ON DISCHARGE:  Stable.   CODE STATUS:  FULL CODE.   DISCHARGE MEDICATIONS:  1.  Prilosec 20 mg delayed-release once a day.  2.  Multivitamin orally.   3.  Vitamin B12 once a month.  4.  Colcrys, colchicine 0.6 mg oral tablet once a day.  5.  Gabapentin 300 mg 3 times a day.  6.  Lisinopril 20 mg once a day.  7.  Percocet every 6 hours as needed for pain.  8.  Amlodipine 10 mg once a day.  9.  Magnesium oxide 400 mg 2 times a day.  10.  Prednisone 10 mg, start with 60 mg and taper by 5 mg until complete.   HOME OXYGEN:  No.   DIET ON DISCHARGE:  Low sodium, low fat, cholesterol-controlled diet.   DIET CONSISTENCY:  Regular.   ACTIVITY:  As tolerated.   TIMEFRAME TO FOLLOW-UP:  PMD in 1 to 2 weeks.  We will check liver function.   PRIMARY CARE PHYSICIAN:  Dr. Halina [name] at [facility].   HISTORY OF PRESENT ILLNESS:  The patient is a 59 year old Caucasian male with a history of CLL, no longer on therapy, hypertension, gout, had gout flare, saw Dr. Jefm [name] in the clinic and was given colchicine and prednisone taper to a few days ago, stayed home in the last few days because of gout which was involving the right ankle.  He went to work and felt very dizzy and he was brought in to the hospital.  His blood pressure was running low in the 80s and he was brought with low blood pressure, remained low, received some fluid and blood pressure arrived at 81/55.  His creatinine level was 2.73 showing severe dehydration and LFTs were elevated so hospitalist service was contacted.  He was also drinking alcohol every day, 5 to 6  vodka, was feeling very dizzy every time when he tried to stand up.   HOSPITAL COURSE AND STAY:  1.  Acute renal failure stage IV on presentation.  IV fluid was given, likely due to medication and dehydration.  We hold nephrotoxic drug, hold colchicine and his creatinine level came up slowly 0.8 on the day of discharge.  2.  Hypotension on presentation.  His blood pressure medication was held and blood pressure came up and he became hypertensive so he was started on lisinopril and amlodipine for better blood pressure control later on.  3.  Gout.  He was having gout flare.  Colchicine was continued and he was started on steroids with tapering slowly.  4.  Elevated LFTs, likely due to alcohol.  He was maintained on CIWA protocol for alcohol withdrawal, but he did not go in withdrawal.  His LFT level came down slowly.  He was advised not to drink anymore and he agreed for that.  5.  Hypomagnesemia, corrected.  6.  Positive UA, but no symptoms.  We did not give any antibiotics.   LABORATORY RESULTS IN THE HOSPITAL:  On presentation, creatinine 2.73, which came down to 0.71.  CO2 was 14 with severe acidosis which came up to 30 on discharge.  Magnesium  was 1.1, which came up gradually and we also discharged him with continuous correction of his magnesium level.  Ethanol level was 103 on admission.  His bilirubin total was 2.8, which came down to 1.9 on discharge.  Alkaline phosphate was 223, which came down to 193.  SGOT 356 to 198 and SGPT from 117 to 84.  TSH was 5.02.  Hemoglobin was 13.3 and platelet count was 85 on discharge.  Urinalysis was positive with 11 WBCs, but negative leukocyte esterase and negative nitrite.    Total time spent in this discharge 45 minutes.     ____________________________ [name] [name] [name] Jungling, MD vgv:ea D: 10/22/2012 22:55:24 ET T: 10/23/2012 03:26:20 ET JOB#: [id number]  cc: [name] [name]. [name] Jungling, MD, <Dictator> [name] Maidens, MD Vaughan Basta  MD ELECTRONICALLY SIGNED 11/11/2012 14:16

## 2014-08-27 NOTE — Discharge Summary (Signed)
PATIENT NAME:  [name] [name], [name] [name] MR#:  [id number] DATE OF BIRTH:  1956/01/18  DATE OF ADMISSION:  11/13/2012 DATE OF DISCHARGE:  11/14/2012  PRIMARY CARE PHYSICIAN: Dr. Halina Maidens.  CONSULTATIONS IN THE HOSPITAL:  1.  Cardiology consultation by Dr. Ida Rogue.  2.  Neurology consultation by Dr. Jennings Books.  DISCHARGE DIAGNOSES:  1.  Recurrent syncope.  2.  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  3.  Gout. 4.  Hypertension.  5.  History of chronic lymphocytic leukemia, in remission.  6.  Alcohol abuse.   DISCHARGE HOME MEDICATIONS:  1.  Prilosec 20 mg p.o. daily.  2.  Colcrys 0.6 mg p.o. daily as needed for gout.  3.  Gabapentin 100 mg in the morning and 200 mg at bedtime.  4.  Multivitamin 1 tablet p.o. daily.  5.  Vitamin B12 1,000 mcg injectable once month on the first of each month.  6.  Lisinopril 5 mg p.o. daily.  7.  Keppra 500 mg p.o. b.i.d.  8.  New medication called Fycompa 3 times a day per Dr. Jennings Books.   DISCHARGE ACTIVITY: As tolerated.   DISCHARGE DIET: Low-sodium diet.    FOLLOWUP INSTRUCTIONS:  1.  Follow up with Dr. Jennings Books as recommended.  2.  Follow up with Dr. Rockey Situ in 2 weeks.   LABS AND IMAGING STUDIES: Prior to discharge, WBC 7.1, hemoglobin 13.3, hematocrit 38.4, platelet count 120. Sodium 140, potassium 4.2, chloride 104, bicarbonate 31, BUN 15, creatinine 0.76, glucose 79 and calcium of 8.7. Magnesium was low at 1.1, which was replaced. Urinalysis negative for any infection. Alcohol level in the serum was elevated at 243 mg/dL. CT of the head without contrast showing frontal tissue hematoma on the right side, otherwise unremarkable. EEG done showing normal EEG and within normal limits.   BRIEF HOSPITAL COURSE: [name] [name] is a pleasant 59 year old Caucasian male with past medical history significant for hypertension, peripheral neuropathy, alcohol use and admissions for recurrent syncope. His last admission was on 11/09/2012 for possible  staring spells and seizures and was discharged on the next day after seen by neurology after his MRI with and without contrast and MRA of the brain were negative. The patient went home and was doing fine. He was discharged on Keppra. He had another syncopal episode preceded by a staring spell in the bathtub and had head injury and was admitted back to the hospital. This episode was not witnessed as he was in the shower this time.  1.  Recurrent syncope, questionable possible seizures. The patient was evaluated. His vital signs were stable at the time because his prior syncopal episodes were associated with hypotension immediately following the episode. He was seen by cardiology, who felt it was less likely to be cardiac arrhythmia; however, cannot completely be ruled out, so he was being discharged on a Holter monitor to be followed by Dr. Rockey Situ in 48 hours. If that does not show anything, a loop  monitor might be started if he is symptomatic. He was also seen by Dr. Jennings Books as a consultation, actually see in the [facility] office as Dr. Manuella Ghazi was not on call that day, but he was kind enough to accept the patient and see the patient the sane day. After Dr. Trena Platt evaluation, he felt that the patient was having insular cortical seizures, which could cause autonomic instability during the episode, which was probably causing the patient's hypotension. The patient has had several side effects to prior antiepileptic medications and  he was getting too drowsy with the higher doses of Keppra, so he is being started on a newer antiepileptic medication called Fycompa and the Keppra dose has been reduced to 500 mg p.o. b.i.d. at this time. The patient was advised to follow up with Dr. Jennings Books and Dr. Rockey Situ as per their recommendations. The patient has not had further episodes in the hospital. He was strongly advised to stay away from alcohol, which is worsening his underlying condition. He has been otherwise  stable. His other home medications were continued without any changes and he was discharged home.   DISCHARGE CONDITION: Stable.   DISCHARGE DISPOSITION: Home.   TIME SPENT ON DISCHARGE: 40 minutes.  ____________________________ Gladstone Lighter, MD rk:aw D: 11/20/2012 07:48:41 ET T: 11/20/2012 08:04:39 ET JOB#: [id number]  cc: Gladstone Lighter, MD, <Dictator> Minna Merritts, MD Hemang K. Manuella Ghazi, MD Gladstone Lighter MD ELECTRONICALLY SIGNED 12/01/2012 13:31

## 2014-08-27 NOTE — Consult Note (Signed)
General Aspect 59 year old male with CLL with treatments, gout, ETOH, recent epsiodes of syncope of uncertain etiology, presenting after another syncopal epsiode. Cardiology was consulted for syncope. Previously admitted on 10/31/2012 for syncope.  He reports being in the shower when he had acute syncope with no warning. He hit his face, mouth, nose, right eye with echymosis noted. He woke up and by his report had confusion. He presented to the ER.  EEG this AM, he felt that the bed was moving. Otherwise feels well. EKG normal, no arrhythmia on telemetry.    At the end of June, He was shopping and was walking without a walker for the first day in some time (gout improved). Leaning against a light pole, he had acute syncope without warning. EMTs found systolic pressures in the 60s. He received IV fluids and blood pressure improved. At the time he was on amlodipine 10 mg daily and lisinopril 20 mg daily. Lab work on admission showed creatinine 0.96, BUN 24, potassium 5.2  Workup in the hospital was unremarkable. No arrhythmia on telemetry. Echocardiogram was essentially normal apart from moderately dilated left and right atrium, mild LVH (read by outside physician) Holter and 30 day monitor had been suggested, but he had declined until after neuro workup. He does report drinking "potato vodkea" periodically, not as much recently. He reports wife is mad at him as she found this drink in his room.  Notes indicate syncope in November and december.   Present Illness . SOCIAL:  Married, works.  Doesn't smoke, Some ETOH  FH  Father died with cancer (bladder), mother had cancer and died with a CVA.  No early heart disease.   Physical Exam:  GEN well developed, well nourished, no acute distress   HEENT red conjunctivae, PERRL   NECK supple  No masses   RESP normal resp effort  clear BS   CARD Regular rate and rhythm  Murmur   ABD denies tenderness  soft   LYMPH negative neck   EXTR  negative edema   SKIN normal to palpation   NEURO motor/sensory function intact   PSYCH alert, A+O to time, place, person, good insight   Review of Systems:  Subjective/Chief Complaint syncope, trauma to face/lips, eye   General: Fatigue   Skin: No Complaints   ENT: No Complaints   Eyes: No Complaints   Neck: No Complaints   Respiratory: No Complaints   Cardiovascular: No Complaints   EKG:  Interpretation EKG today shows normal sinus rhythm with rate 74 beats per minute with no significant ST or T wave changes.   Radiology Results: CT:    10-Jul-14 16:16, CT Head Without Contrast  CT Head Without Contrast   REASON FOR EXAM:    syncope with head trauma  COMMENTS:       PROCEDURE: CT  - CT HEAD WITHOUT CONTRAST  - Nov 13 2012  4:16PM     RESULT: History: Syncope.    Comparison Study: Prior CT of 11/09/2012.    Findings: No mass. No hydrocephalus. No hemorrhage. Right frontal scalp   hematoma noted. No acute bony abnormality identified.    IMPRESSION:  Right frontal soft tissue hematoma. Exam otherwise   unremarkable. No intracranial abnormality.    Verified By: Osa Craver, M.D., MD    PCN: Swelling  Allopurinol: Rash  Other- Explain in Comments Line: Rash  Sulfa drugs: Unknown  Uloric: Other  Vital Signs/Nurse's Notes: **Vital Signs.:   11-Jul-14 03:46  Vital Signs Type Routine  Temperature Temperature (F) 99.4  Celsius 37.4  Temperature Source oral  Pulse Pulse 75  Respirations Respirations 16  Systolic BP Systolic BP 242  Diastolic BP (mmHg) Diastolic BP (mmHg) 93  Pulse Lying Pulse Lying 68  Systolic BP Systolic BP 353  Diastolic BP (mmHg) Diastolic BP (mmHg) 614  Pulse Pulse Sitting 75  Systolic BP Systolic BP 431  Diastolic BP (mmHg) Diastolic BP (mmHg) 93  Pulse Standing Pulse Standing 83  Pulse Ox % Pulse Ox % 95  Pulse Ox Activity Level  At rest  Oxygen Delivery Room Air/ 21 %    Impression 59 year old male with CLL with  treatments, gout, ETOH, recent epsiodes of syncope of uncertain etiology, presenting after anotyher syncopal epsiode. Cardiology was consulted for syncope. Previously admitted on 10/31/2012 for syncope.  1) Syncope: acute onset EEG this AM, results not available. Some ETOH, uncertain how much. Wife found ETOH in his room. Acute onset also concerning for arrhythmia. Will order a 48 hour holter, with 30 day monitor ordered to his house.  He has follow up with neurology this afternoon to review EEG, discuss seizure medication dosing.  2) ETOH: Suggested no ETOH at this time until we determine the cause of his syncope  3) Gout: recent severe epsiode, improved, now walking.  4) CLL: previous treatment in remission   Electronic Signatures: Ida Rogue (MD)  (Signed 11-Jul-14 11:50)  Authored: General Aspect/Present Illness, History and Physical Exam, Review of System, Home Medications, EKG , Radiology, Allergies, Vital Signs/Nurse's Notes, Impression/Plan   Last Updated: 11-Jul-14 11:50 by Ida Rogue (MD)

## 2014-08-27 NOTE — H&P (Signed)
PATIENT NAME:  KIERON, KANTNER MR#:  [id number] DATE OF BIRTH:  [birth year]  DATE OF ADMISSION:  10/15/2012  REFERRING PHYSICIAN:  Dr. Ulice Brilliant  PRIMARY CARE PHYSICIAN:  Dr. Army Melia  CHIEF COMPLAINT:  Dizziness.   HISTORY OF PRESENT ILLNESS:  The patient is a pleasant 59 year old Caucasian male with a history of CLL, no longer on therapeutic it appears, hypertension, gout, who recently had a gout flare, saw Dr. Jefm Bryant and was given colchicine and a prednisone taper a few days ago.  He is on the 20 mg dose of prednisone taper and still takes the colchicine.  He has stayed home in the last few days because of the gout, which is involving his right ankle.  He went to work today and felt dizzy and there at work EMS was called because the patient stated that "he did not look good to his coworkers."  Per EMS, his blood pressure was running low in the 80s and he was brought here where, again, his blood pressure has remained low. He has received some fluids and his blood pressure on arrival was 81/45.  Last blood pressure is 91/48.  Furthermore, he has multiple lab abnormalities including new renal failure with GFR of 25, creatinine of 2.73, elevated LFTs.  Hospitalist services were contacted for further evaluation and management.   He drinks heavily.  He states that he usually drinks about 5 to 6 vodka drinks a day and in the last few days because he is feeling depressed and because of the gout, he has drank more.  He drank 9 alcoholic beverages the last 24 hours. The last drink was last night for his wife's birthday.  He denies having any fevers, but states he feels dizzy when he stands up.   PAST MEDICAL HISTORY:  CLL, sees Dr. Oliva Bustard. Gout with a recent flare. Hypertension, GERD, history of nephrolithiasis, neuropathy in lower extremities which he attributes to chemo, depression, anxiety.   PAST SURGICAL HISTORY:  Tonsillectomy, adenoidectomy, two polyps removed from the colon, history of left knee  surgery.   FAMILY HISTORY:  Father had bladder cancer. Mom had colon cancer and stroke.   SOCIAL HISTORY:  Works for [employer], married, has 3 kids. No smoking. Drinks 5 to 6 vodka drinks daily, more over the weekends.  In the last few days he has drank more.  Yesterday had 9 drinks. He does not drink in the morning. He drinks after work. Denies having any DTs or shakes.   OUTPATIENT MEDICATIONS: 1.  Lisinopril 20 mg daily. 2. Neurontin 100 mg in the morning, 200 mg at night.  3.  Prilosec 20 mg daily.  4.  Colchicine as needed and he has been taking it recently 0.6 mg once a day. 5.  He is on prednisone taper today 20 mg x 1 and tomorrow 10 mg x 1 and then stop. 6.  Indomethacin p.r.n. 7.  Multivitamin 1 tab daily. 8.  Vitamin B12 injectable once per month. 9.  Aleve 2 pills daily.  REVIEW OF SYSTEMS:  CONSTITUTIONAL:  No fever, fatigue, weight changes or generalized weakness.  EYES:  No blurry vision or double vision.  ENT:  No tinnitus or hearing loss.  RESPIRATORY:  No cough, shortness of breath, wheezing, asthma or COPD.  CARDIOVASCULAR: Denies chest pain or swelling in the legs. Denies having any history of arrhythmias.  Has high blood pressure.  GASTROINTESTINAL: Had nausea and vomiting x 1 this morning, which he states is usual for him.  No abdominal  pain, hematemesis.  Has a history of fatty liver and elevated LFTs in the past.  GENITOURINARY:  Denies dysuria, hematuria, frequency or incontinence. HEMATOLOGIC AND LYMPHATIC:  No anemia or easy bruising.  Has CLL.  SKIN:  No rashes.  MUSCULOSKELETAL:  Recent gout flare in the right ankle which has resolved. NEUROLOGIC:  No focal weakness or numbness. No seizure or CVA. PSYCHIATRIC:  Has anxiety and depression and feels more depressed the last few days.   PHYSICAL EXAMINATION: VITAL SIGNS:  Temperature on arrival 98.4, pulse rate 96, respiratory 20, blood pressure 81/45, last blood pressure 91/48.  O2 sat on arrival 93% on room air.   GENERAL:  The patient is a well-developed Caucasian, obese male lying in bed.  HEENT: Normocephalic, atraumatic. Pupils are equal and reactive. Anicteric sclerae. Extraocular muscles intact. Very dry mucous membranes.  NECK:  Supple.  No thyroid tenderness.  No cervical lymphadenopathy.  CARDIOVASCULAR:  S1, S2 regular rate and rhythm. No murmurs, rubs or gallops.  LUNGS:  Clear to auscultation without wheezing, rhonchi or rales.  ABDOMEN: Soft, nontender, obese. No rebound, guarding. Positive bowel sounds in all quadrants.  No organomegaly appreciated.  EXTREMITIES:  No significant lower extremity edema.  NEUROLOGIC:  Cranial nerves II through XII appears to be grossly intact. Strength is 5 out of 5 in all extremities.  Sensation is intact to light touch.  PSYCHIATRIC:  Awake, alert, oriented x 3, pleasant, cooperative.  SKIN:  No obvious rashes.  No tremor on extension of his hands.   LABORATORY DATA:  Creatinine 2.73, sodium 133, potassium 3.7, chloride 93, serum CO2 14, anion gap of 26, albumin 3.3, bilirubin 2.8, alkaline phosphatase 223, which is higher than 176 in April and bilirubin has gone from 1.4 on April 3 to 2.8.  AST, ALT are elevated about the same baseline as April 3. Troponin negative.  CK total, CK-MB within normal limits. WBC 6.4, hemoglobin 14, platelets 161.  UA:  1+ ketones, 1+ blood, greater than 500 protein. No nitrites or leukocyte esterase, 3 RBCs, 11 WBCs, trace bacteria.  There is WBC clump, mucus and hyaline casts.   EKG:  Normal sinus rhythm. Rate is 92.  Prolonged QTc of 489 msec, but no acute ST elevations or depressions.  No imaging.   ASSESSMENT AND PLAN:  We have a pleasant 59 year old male with multiple comorbidities including hypertension, recent gout flare on prednisone and colchicine, as well as Aleve, history of fatty liver and chronic alcohol abuse with more alcohol ingestion in the last few days, decreased p.o. intake recently because of the gout flare  presents with dizziness, hypotension, acute renal failure and elevated transaminase with acidosis and hypotension. I would admit the patient to the hospital with remote telemetry.  In regards to his acute renal failure, this likely is acute tubular necrosis from dehydration, hypotension and possibly medications.  We would hold all nephrotoxins, start the patient on IV fluid and increase the blood pressure by giving him boluses.  I would check urine, creatinine, sodium and eosinophils.  I would hold the colchicine and the ACE inhibitor at this point and consider renal consult if GFR does not improve with hydration.  I would correct the hypotension with fluids and holding the blood pressure medications.  He does not appear to be in shock at this point.  In regards to his gout, he does not appear to be in a flare in his right ankle.  I would hold the colchicine but continue the prednisone taper for  2 more days.  He is significantly acidotic with anion gap metabolic acidosis.  This could be secondary to renal failure and ketosis from alcohol.  We would trend the GFR and consider renal consult if this does not get better.  We would monitor him carefully with remote telemetry in regards to his prolonged QT and hold his antidepressants, which can prolong the QT.  Would check magnesium, as well as alcohol levels.  Although he has a positive urinalysis, it is more in line with ATN, including the WBC and clumps found and he does not have any symptoms of urinary tract infection.  We would check a urine culture for now and we would hold antibiotics.  We will start him on CIWA protocol.  He does not appear to be in any delirium tremens.  We will start him on a multivitamin, folate and thiamine with heparin for deep vein thrombosis prophylaxis.  CRITICAL CARE TIME:  65 minutes.   CODE STATUS:  FULL CODE    ____________________________ Vivien Presto, MD sa:ce D: 10/15/2012 11:27:40 ET T: 10/15/2012 12:50:09  ET JOB#: [id number]  cc: Vivien Presto, MD, <Dictator> Halina Maidens, MD  Vivien Presto MD ELECTRONICALLY SIGNED 11/02/2012 22:13

## 2014-08-27 NOTE — Consult Note (Signed)
PATIENT NAME:  [name] [name], [name] [name] MR#:  [id number] DATE OF BIRTH:  1956-01-24  DATE OF CONSULTATION:  11/10/2012  REFERRING PHYSICIAN:   CONSULTING PHYSICIAN:  Leotis Pain, MD  REASON FOR CONSULTATION: Rule out complex partial seizures.   HISTORY OF PRESENT ILLNESS: This is a 59 year old Caucasian male with past medical history of CLL in remission, past medical history of hypertension, vasovagal syncope, status post recent discharge on 11/02/2012 from the hospital because of a workup of vasovagal syncope, who presented with multiple episodes of blank staring spells. All information is obtained from the patient's wife. The patient does not remember the event. Episodes are described as the patient talking to his wife and would stop midsentence confused, disoriented, would have a blank stare and would not follow commands. After these episodes lasting a minute or 2, the patient would be very tired and needing to sleep. A total of about 4 of these episodes have happened in the past 4 days. No automatic movements with his upper extremities, with his face or lower extremities. The patient does not have any past medical history of seizures. No family history of seizure activity. The patient is status post tele neurology and now was stared on Keppra a gram twice a day.   PAST MEDICAL HISTORY: CLL with gout, hypertension, GERD, nephrolithiasis, neuropathy, depression, and anxiety.   ALLERGIES: INCLUDE ALLOPURINOL, PENICILLIN AND SULFA.  FAMILY HISTORY:  Father died of bladder cancer. Mother had colon cancer and a stroke. Both parents are deceased.   SOCIAL HISTORY: The patient used to be a Dealer for [employer] He currently does not smoke and is a social drinker. No illicit drug use.   REVIEW OF SYSTEMS: CONSTITUTIONAL: No fever. No fatigue. No weakness. EYES: No blurred vision. No pain. No redness. EARS, NOSE AND THROAT: Appeared to be intact.  RESPIRATORY: No shortness of breath. CARDIOVASCULAR: No chest  pain. GASTROINTESTINAL: No nausea, no vomiting or diarrhea. GENITOURINARY: No dysuria, no hematuria. ENDOCRINE: No thyroid problems. HEMATOLOGIC: No anemia. No easy bruising. MUSCULOSKELETAL: No joint pain. PSYCHIATRIC: Has some history of anxiety and depression.   HOME MEDICATIONS: Includes:  1.  Colcrys.  2.  Gabapentin.  3.  Lisinopril. 4.  Multivitamin.  5.  Oxycodone.  6.  Prilosec. 7.  Vitamin B12.  The patient is status post MRI and MRA. MRI of the brain just shows some fluid in the mastoids, otherwise unremarkable. MRA of the head did not show any significant stenosis.   PHYSICAL EXAMINATION:  VITAL SIGNS: The patient's temperature is 98.6, pulse 69, respirations 17, blood pressure 148/83.  NEUROLOGICAL: The patient is alert, awake and oriented to time, place, location and the reason why he is in the hospital. He is able to tell me the President of the [place] and is able tell me how many quarters are in a dollar. Cranial examination: Extraocular movements appear to be intact. Pupils 3 mm to 2 mm, reactive bilaterally. Visual fields intact bilaterally. Facial sensation intact. Facial motor intact. Tongue is midline. Uvula elevates symmetrically. Shoulder shrug intact. Motor strength examination appears to be 5 out of 5 bilateral upper and lower extremities. Coordination: Finger-to-nose intact. Sensation decreased in the lower extremities by his heels because of the history of CLL and chemotherapy. Reflexes severely diminished in the lower extremities and 1+ in the upper extremities. Gait not assessed.   IMPRESSION: A 59 year old male with history of CLL, hypertension, history of vasovagal syncope, status post discharge on 11/02/2012 for vasovagal syncope workup, who presents with multiple  episodes of blank stares,  suspected to be possibility of complex partial seizures. MRI and MRA as above did not show any acute intracranial pathology. Her laboratory workup has been reviewed as  above. The patient's symptoms could be a possibility of contributing to vasovagal syncope producing seizure-like events. At the same time, the patient does appear to be a postictal from this event so there is a possibility of him having these /complex partial seizures.   PLAN: I would continue the Keppra 1 gram b.i.d. I would perform EEG as an outpatient. That would be a prolonged study. The patient's wife does want to see a neurologist at [facility]. No further imaging at this time. The patient is safe to be discharged from neurological standpoint.   Thank you. It was a pleasure seeing this patient.  ____________________________ Leotis Pain, MD yz:aw D: 11/10/2012 12:19:30 ET T: 11/10/2012 13:22:48 ET JOB#: [id number]  cc: Leotis Pain, MD, <Dictator> Leotis Pain MD ELECTRONICALLY SIGNED 12/15/2012 14:49

## 2014-08-28 NOTE — Consult Note (Signed)
PATIENT NAME:  [name] [name], [name] [name] MR#:  [id number] DATE OF BIRTH:  31-Aug-1955  DATE OF CONSULTATION:  06/06/2013  CONSULTING PHYSICIAN:  Anay Walter K. Floreine Kingdon, MD  SUBJECTIVE:  The patient was seen in consultation, room ED BHU-3.  The patient is a 59 year old white male not employed and is on disability for gout. The patient is married and lives with his wife.  The patient had a long history of alcohol drinking and being sober and started drinking again last night and drank about 1 pint which really got to him.  He became very agitated because he was intoxicated.  His wife got concerned as  there are guns around the house and she was afraid that he was going to use them.  The patient comes to the [facility] Emergency Room and then the wife reports that he is accepted for a drug rehab and treatment program in [place] and that she will be giving him a ride on 06/07/2013.    OBJECTIVE:  The patient is dressed in hospital clothes.  Alert and oriented.  Much calmer, pleasant and cooperative.  .  The patient admits that he really got drunk and that 1 pint of Cognac really got to him last night.  No psychosis.  Coming out of alcohol well and tolerating detox well.  Denies any active suicidal or homicidal plans.  Behavior is unpredictable and wife thinks if he goes home now he will start drinking and get drunk and then probably with guns around, she cannot trust him for safety. Insight and judgement guarded.  IMPRESSION:   Alcohol dependence chronic.  Continues with intoxication.    RECOMMENDATION:  The patient is encouraged to stay here for the night and tomorrow morning, that is on 06/07/2013, the patient's wife will come and give him a ride to a drug rehab program in [place].     ____________________________ Wallace Cullens. Franchot Mimes, MD skc:dp D: 06/06/2013 16:11:16 ET T: 06/06/2013 17:25:02 ET JOB#: [id number]  cc: Arlyn Leak K. Franchot Mimes, MD, <Dictator> Dewain Penning MD ELECTRONICALLY SIGNED 06/07/2013 16:45

## 2014-08-30 DIAGNOSIS — E639 Nutritional deficiency, unspecified: Secondary | ICD-10-CM | POA: Insufficient documentation

## 2014-10-01 ENCOUNTER — Encounter: Payer: Self-pay | Admitting: Internal Medicine

## 2014-10-01 DIAGNOSIS — I4891 Unspecified atrial fibrillation: Secondary | ICD-10-CM | POA: Insufficient documentation

## 2014-10-01 DIAGNOSIS — M1A9XX1 Chronic gout, unspecified, with tophus (tophi): Secondary | ICD-10-CM | POA: Insufficient documentation

## 2014-10-01 DIAGNOSIS — F1020 Alcohol dependence, uncomplicated: Secondary | ICD-10-CM | POA: Insufficient documentation

## 2014-10-01 DIAGNOSIS — G620 Drug-induced polyneuropathy: Secondary | ICD-10-CM | POA: Insufficient documentation

## 2014-10-01 DIAGNOSIS — E291 Testicular hypofunction: Secondary | ICD-10-CM | POA: Insufficient documentation

## 2014-10-01 DIAGNOSIS — IMO0002 Reserved for concepts with insufficient information to code with codable children: Secondary | ICD-10-CM | POA: Insufficient documentation

## 2014-10-01 DIAGNOSIS — F324 Major depressive disorder, single episode, in partial remission: Secondary | ICD-10-CM | POA: Insufficient documentation

## 2014-10-01 DIAGNOSIS — K7030 Alcoholic cirrhosis of liver without ascites: Secondary | ICD-10-CM | POA: Insufficient documentation

## 2014-10-01 DIAGNOSIS — K219 Gastro-esophageal reflux disease without esophagitis: Secondary | ICD-10-CM | POA: Insufficient documentation

## 2014-10-01 DIAGNOSIS — G4700 Insomnia, unspecified: Secondary | ICD-10-CM | POA: Insufficient documentation

## 2014-10-01 DIAGNOSIS — D510 Vitamin B12 deficiency anemia due to intrinsic factor deficiency: Secondary | ICD-10-CM | POA: Insufficient documentation

## 2014-11-05 ENCOUNTER — Ambulatory Visit (INDEPENDENT_AMBULATORY_CARE_PROVIDER_SITE_OTHER): Payer: Managed Care, Other (non HMO) | Admitting: Internal Medicine

## 2014-11-05 ENCOUNTER — Other Ambulatory Visit: Payer: Self-pay | Admitting: Internal Medicine

## 2014-11-05 ENCOUNTER — Encounter: Payer: Self-pay | Admitting: Internal Medicine

## 2014-11-05 ENCOUNTER — Other Ambulatory Visit: Payer: Self-pay | Admitting: *Deleted

## 2014-11-05 VITALS — BP 122/98 | HR 102 | Temp 99.1°F | Ht 70.0 in | Wt 237.6 lb

## 2014-11-05 DIAGNOSIS — J40 Bronchitis, not specified as acute or chronic: Secondary | ICD-10-CM | POA: Diagnosis not present

## 2014-11-05 DIAGNOSIS — C9110 Chronic lymphocytic leukemia of B-cell type not having achieved remission: Secondary | ICD-10-CM

## 2014-11-05 MED ORDER — MOXIFLOXACIN HCL 400 MG PO TABS: 400.0000 mg | ORAL_TABLET | Freq: Every day | ORAL | Status: DC

## 2014-11-05 MED ORDER — GABAPENTIN 300 MG PO CAPS
600.0000 mg | ORAL_CAPSULE | Freq: Three times a day (TID) | ORAL | Status: AC
Start: 2014-11-05 — End: ?

## 2014-11-05 MED ORDER — BACLOFEN 10 MG PO TABS: 10.0000 mg | ORAL_TABLET | Freq: Three times a day (TID) | ORAL | Status: DC

## 2014-11-05 MED ORDER — ZINC SULFATE 220 (50 ZN) MG PO CAPS: 220.0000 mg | ORAL_CAPSULE | Freq: Every day | ORAL | Status: AC

## 2014-11-05 MED ORDER — OMEPRAZOLE 10 MG PO CPDR: 20.0000 mg | DELAYED_RELEASE_CAPSULE | Freq: Every day | ORAL | Status: DC

## 2014-11-05 MED ORDER — MELATONIN 3 MG PO TABS: 3.0000 mg | ORAL_TABLET | Freq: Every day | ORAL | Status: AC

## 2014-11-05 MED ORDER — LACTULOSE 10 GM/15ML PO SOLN
10.0000 g | Freq: Two times a day (BID) | ORAL | Status: DC
Start: 2014-11-05 — End: 2015-01-13

## 2014-11-05 MED ORDER — SPIRONOLACTONE 100 MG PO TABS: 100.0000 mg | ORAL_TABLET | Freq: Every day | ORAL | Status: AC

## 2014-11-05 MED ORDER — MAGNESIUM OXIDE 400 MG PO CAPS: 1.0000 | ORAL_CAPSULE | Freq: Two times a day (BID) | ORAL | Status: DC

## 2014-11-05 MED ORDER — FUROSEMIDE 40 MG PO TABS: 40.0000 mg | ORAL_TABLET | Freq: Every day | ORAL | Status: AC

## 2014-11-05 MED ORDER — LISINOPRIL 5 MG PO TABS
5.0000 mg | ORAL_TABLET | Freq: Every day | ORAL | Status: AC
Start: 2014-11-05 — End: ?

## 2014-11-05 MED ORDER — POTASSIUM CHLORIDE CRYS ER 20 MEQ PO TBCR: 20.0000 meq | EXTENDED_RELEASE_TABLET | Freq: Every day | ORAL | Status: AC

## 2014-11-05 MED ORDER — RIFAXIMIN 550 MG PO TABS: 550.0000 mg | ORAL_TABLET | Freq: Two times a day (BID) | ORAL | Status: DC

## 2014-11-05 NOTE — Progress Notes (Signed)
Date:  11/05/2014   Name:  [name] Dudding Cowman Sr.   DOB:  09-12-1955   MRN:  [id number]   Chief Complaint: Cough and Nasal Congestion Cough This is a new problem. The current episode started in the past 7 days. The problem has been gradually worsening. The problem occurs hourly. The cough is productive of brown sputum and productive of bloody sputum (also bloody sinus discharge). Associated symptoms include a rash and shortness of breath. Pertinent negatives include no chest pain, chills, ear pain, fever, sore throat, sweats, weight loss or wheezing. Nothing aggravates the symptoms. He has tried nothing for the symptoms.   He completed intensive in-patient alcohol rehab in [place] about three weeks ago.  Since being home he attends and IOP program in [place].  He denies any alcohol intake.  He has multiple prescriptions that need to be filled.  He has been getting them week to week from the [facility] (rx's were written by the rehab at discharge for only 7 days at a time).  He would like to get them back to [facility].  Review of Systems:  Review of Systems  Constitutional: Positive for fatigue. Negative for fever, chills and weight loss.  HENT: Negative for ear pain, sinus pressure, sneezing, sore throat and voice change.   Respiratory: Positive for cough and shortness of breath. Negative for chest tightness and wheezing.   Cardiovascular: Positive for palpitations. Negative for chest pain.  Gastrointestinal: Negative for abdominal pain and blood in stool.  Skin: Positive for rash.  Neurological: Negative for light-headedness.  Hematological: Negative for adenopathy.  Psychiatric/Behavioral: Negative for confusion and agitation.    Patient Active Problem List   Diagnosis Date Noted  . ALC (alcoholic liver cirrhosis) 10/01/2014  . Alcoholic 05/09/7251  . A-fib 10/01/2014  . 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 Acid reflux 10/01/2014  . 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 Hypomagnesemia  10/01/2014  . Insomnia, persistent 10/01/2014  . Depression, major, single episode, in partial remission 10/01/2014  . Drug related polyneuropathy 10/01/2014  . Addison anemia 10/01/2014  . Change in blood platelet count 10/01/2014  . Neurocardiogenic syncope 03/20/2013  . Syncope 11/04/2012  . CLL (chronic lymphocytic leukemia) 11/04/2012  . Essential hypertension 11/04/2012    Prior to Admission medications   Medication Sig Start Date End Date Taking? Authorizing Provider  amLODipine (NORVASC) 5 MG tablet Take 1 tablet by mouth daily. 07/14/13  Yes Historical Provider, MD  colchicine 0.6 MG tablet Take 0.6 mg by mouth daily as needed.   Yes Historical Provider, MD  Cyanocobalamin (VITAMIN B 12 PO) Take by mouth every 30 (thirty) days.    Yes Historical Provider, MD  DULoxetine (CYMBALTA) 60 MG capsule Take 1 capsule by mouth daily. 03/17/14  Yes Historical Provider, MD  folic acid (FOLVITE) 1 MG tablet Take 1 tablet by mouth daily.   Yes Historical Provider, MD  furosemide (LASIX) 40 MG tablet Take 1 tablet by mouth daily. 08/31/14 08/31/15 Yes Historical Provider, MD  gabapentin (NEURONTIN) 400 MG capsule Take 1 capsule by mouth 3 (three) times daily. 03/17/14  Yes Historical Provider, MD  lisinopril (PRINIVIL,ZESTRIL) 5 MG tablet Take 1 tablet (5 mg total) by mouth daily. 11/04/12  Yes Minna Merritts, MD  Magnesium Oxide 400 MG CAPS Take 1 capsule by mouth 2 (two) times daily.   Yes Historical Provider, MD  metoprolol succinate (TOPROL-XL) 50 MG 24 hr tablet Take 1 tablet by mouth daily.   Yes Historical Provider, MD  Multiple Vitamin (  MULTIVITAMIN) tablet Take 1 tablet by mouth daily.   Yes Historical Provider, MD  Omega-3 Fatty Acids (FISH OIL) 1000 MG CAPS Take 1 capsule by mouth daily.   Yes Historical Provider, MD  omeprazole (PRILOSEC) 10 MG capsule Take 10 mg by mouth daily.   Yes Historical Provider, MD  potassium chloride SA (K-DUR,KLOR-CON) 20 MEQ tablet Take 1 tablet by mouth  daily. 10/14/13  Yes Historical Provider, MD  spironolactone (ALDACTONE) 50 MG tablet Take 1 tablet by mouth daily. 07/14/14  Yes Historical Provider, MD  zolpidem (AMBIEN) 10 MG tablet Take 1 tablet by mouth at bedtime as needed. 05/20/14  Yes Historical Provider, MD  apixaban (ELIQUIS) 5 MG TABS tablet Take 1 tablet by mouth 2 (two) times daily.    Historical Provider, MD  oxyCODONE (OXYCONTIN) 10 MG 12 hr tablet Take 10 mg by mouth every 4 (four) hours as needed for pain.    Historical Provider, MD  predniSONE (DELTASONE) 20 MG tablet Take 20 mg by mouth daily. taper    Historical Provider, MD    Allergies  Allergen Reactions  . Penicillins Anaphylaxis  . Allopurinol     Other reaction(s): Anaphylaxis  . Perampanel Other (See Comments)    Liver issues  . Uloric [Febuxostat]   . Sulfa Antibiotics Rash    Past Surgical History  Procedure Laterality Date  . Tonsillectomy and adenoidectomy    . Knee surgery      x 2  . Colonoscopy w/ polypectomy    . Knee surgery Left 04/2012    x2  . Cardiac electrophysiology mapping and ablation  09/2013    History  Substance Use Topics  . Smoking status: Never Smoker   . Smokeless tobacco: Not on file  . Alcohol Use: 0.0 oz/week    0 Standard drinks or equivalent per week     Comment: rarely     Medication list has been reviewed and updated.  Physical Examination:  Physical Exam  Constitutional: He appears well-developed and well-nourished. No distress.  HENT:  Right Ear: Tympanic membrane, external ear and ear canal normal.  Left Ear: Tympanic membrane, external ear and ear canal normal.  Nose: Right sinus exhibits maxillary sinus tenderness. Right sinus exhibits no frontal sinus tenderness. Left sinus exhibits maxillary sinus tenderness. Left sinus exhibits no frontal sinus tenderness.  Mouth/Throat: Uvula is midline and mucous membranes are normal. Posterior oropharyngeal erythema present. No posterior oropharyngeal edema.  Neck:  Neck supple. No tracheal tenderness present. Carotid bruit is not present.  Cardiovascular: An irregularly irregular rhythm present.  Pulmonary/Chest: No respiratory distress. He has no decreased breath sounds. He has no wheezes.  Psychiatric: He has a normal mood and affect.  Nursing note and vitals reviewed.   BP 122/98 mmHg  Pulse 102  Temp(Src) 99.1 F (37.3 C)  Ht 5\' 10"  (1.778 m)  Wt 237 lb 9.6 oz (107.775 kg)  BMI 34.09 kg/m2  SpO2 97%  Assessment and Plan: 1. Bronchitis - moxifloxacin (AVELOX) 400 MG tablet; Take 1 tablet (400 mg total) by mouth daily.  Dispense: 7 tablet; Refill: 0  Multiple other prescriptions refilled.  We discussed that he must get any changes to his medications from the specialists refilled by them. He agrees.  Halina Maidens, MD [facility]  11/05/2014

## 2014-11-10 ENCOUNTER — Inpatient Hospital Stay: Payer: Managed Care, Other (non HMO) | Attending: Oncology

## 2014-11-10 ENCOUNTER — Inpatient Hospital Stay: Payer: Managed Care, Other (non HMO) | Admitting: Oncology

## 2014-11-13 ENCOUNTER — Other Ambulatory Visit: Payer: Self-pay | Admitting: Internal Medicine

## 2014-12-09 ENCOUNTER — Inpatient Hospital Stay: Payer: Managed Care, Other (non HMO) | Admitting: Oncology

## 2014-12-09 ENCOUNTER — Inpatient Hospital Stay: Payer: Managed Care, Other (non HMO) | Attending: Oncology

## 2014-12-09 DIAGNOSIS — Z79899 Other long term (current) drug therapy: Secondary | ICD-10-CM | POA: Insufficient documentation

## 2014-12-09 DIAGNOSIS — R160 Hepatomegaly, not elsewhere classified: Secondary | ICD-10-CM | POA: Insufficient documentation

## 2014-12-09 DIAGNOSIS — Z9221 Personal history of antineoplastic chemotherapy: Secondary | ICD-10-CM | POA: Insufficient documentation

## 2014-12-09 DIAGNOSIS — Z807 Family history of other malignant neoplasms of lymphoid, hematopoietic and related tissues: Secondary | ICD-10-CM | POA: Insufficient documentation

## 2014-12-09 DIAGNOSIS — K219 Gastro-esophageal reflux disease without esophagitis: Secondary | ICD-10-CM | POA: Insufficient documentation

## 2014-12-09 DIAGNOSIS — R55 Syncope and collapse: Secondary | ICD-10-CM | POA: Insufficient documentation

## 2014-12-09 DIAGNOSIS — Z87442 Personal history of urinary calculi: Secondary | ICD-10-CM | POA: Insufficient documentation

## 2014-12-09 DIAGNOSIS — R531 Weakness: Secondary | ICD-10-CM | POA: Insufficient documentation

## 2014-12-09 DIAGNOSIS — M109 Gout, unspecified: Secondary | ICD-10-CM | POA: Insufficient documentation

## 2014-12-09 DIAGNOSIS — F419 Anxiety disorder, unspecified: Secondary | ICD-10-CM | POA: Insufficient documentation

## 2014-12-09 DIAGNOSIS — R5383 Other fatigue: Secondary | ICD-10-CM | POA: Insufficient documentation

## 2014-12-09 DIAGNOSIS — C9111 Chronic lymphocytic leukemia of B-cell type in remission: Secondary | ICD-10-CM | POA: Insufficient documentation

## 2014-12-09 DIAGNOSIS — I4891 Unspecified atrial fibrillation: Secondary | ICD-10-CM | POA: Insufficient documentation

## 2014-12-09 DIAGNOSIS — R011 Cardiac murmur, unspecified: Secondary | ICD-10-CM | POA: Insufficient documentation

## 2014-12-09 DIAGNOSIS — I10 Essential (primary) hypertension: Secondary | ICD-10-CM | POA: Insufficient documentation

## 2014-12-09 DIAGNOSIS — Z800 Family history of malignant neoplasm of digestive organs: Secondary | ICD-10-CM | POA: Insufficient documentation

## 2014-12-09 DIAGNOSIS — Z8052 Family history of malignant neoplasm of bladder: Secondary | ICD-10-CM | POA: Insufficient documentation

## 2014-12-09 DIAGNOSIS — F329 Major depressive disorder, single episode, unspecified: Secondary | ICD-10-CM | POA: Insufficient documentation

## 2014-12-13 ENCOUNTER — Other Ambulatory Visit: Payer: Self-pay | Admitting: Internal Medicine

## 2014-12-16 ENCOUNTER — Encounter (INDEPENDENT_AMBULATORY_CARE_PROVIDER_SITE_OTHER): Payer: Self-pay

## 2014-12-16 ENCOUNTER — Inpatient Hospital Stay: Payer: Managed Care, Other (non HMO) | Admitting: Oncology

## 2014-12-16 ENCOUNTER — Inpatient Hospital Stay: Payer: Managed Care, Other (non HMO)

## 2014-12-16 ENCOUNTER — Encounter: Payer: Self-pay | Admitting: Oncology

## 2014-12-16 ENCOUNTER — Inpatient Hospital Stay (HOSPITAL_BASED_OUTPATIENT_CLINIC_OR_DEPARTMENT_OTHER): Payer: Managed Care, Other (non HMO)

## 2014-12-16 VITALS — BP 121/82 | HR 94 | Temp 97.3°F | Wt 253.4 lb

## 2014-12-16 DIAGNOSIS — C9110 Chronic lymphocytic leukemia of B-cell type not having achieved remission: Secondary | ICD-10-CM

## 2014-12-16 DIAGNOSIS — C9111 Chronic lymphocytic leukemia of B-cell type in remission: Secondary | ICD-10-CM | POA: Diagnosis present

## 2014-12-16 DIAGNOSIS — Z8052 Family history of malignant neoplasm of bladder: Secondary | ICD-10-CM | POA: Diagnosis not present

## 2014-12-16 DIAGNOSIS — R160 Hepatomegaly, not elsewhere classified: Secondary | ICD-10-CM | POA: Diagnosis not present

## 2014-12-16 DIAGNOSIS — R5383 Other fatigue: Secondary | ICD-10-CM | POA: Diagnosis not present

## 2014-12-16 DIAGNOSIS — R55 Syncope and collapse: Secondary | ICD-10-CM | POA: Diagnosis not present

## 2014-12-16 DIAGNOSIS — M109 Gout, unspecified: Secondary | ICD-10-CM | POA: Diagnosis not present

## 2014-12-16 DIAGNOSIS — I10 Essential (primary) hypertension: Secondary | ICD-10-CM | POA: Diagnosis not present

## 2014-12-16 DIAGNOSIS — Z87442 Personal history of urinary calculi: Secondary | ICD-10-CM | POA: Diagnosis not present

## 2014-12-16 DIAGNOSIS — Z800 Family history of malignant neoplasm of digestive organs: Secondary | ICD-10-CM | POA: Diagnosis not present

## 2014-12-16 DIAGNOSIS — I4891 Unspecified atrial fibrillation: Secondary | ICD-10-CM | POA: Diagnosis not present

## 2014-12-16 DIAGNOSIS — F329 Major depressive disorder, single episode, unspecified: Secondary | ICD-10-CM | POA: Diagnosis not present

## 2014-12-16 DIAGNOSIS — K219 Gastro-esophageal reflux disease without esophagitis: Secondary | ICD-10-CM | POA: Diagnosis not present

## 2014-12-16 DIAGNOSIS — Z807 Family history of other malignant neoplasms of lymphoid, hematopoietic and related tissues: Secondary | ICD-10-CM | POA: Diagnosis not present

## 2014-12-16 DIAGNOSIS — R531 Weakness: Secondary | ICD-10-CM | POA: Diagnosis not present

## 2014-12-16 DIAGNOSIS — Z9221 Personal history of antineoplastic chemotherapy: Secondary | ICD-10-CM | POA: Diagnosis not present

## 2014-12-16 DIAGNOSIS — Z79899 Other long term (current) drug therapy: Secondary | ICD-10-CM | POA: Diagnosis not present

## 2014-12-16 DIAGNOSIS — F419 Anxiety disorder, unspecified: Secondary | ICD-10-CM | POA: Diagnosis not present

## 2014-12-16 DIAGNOSIS — R011 Cardiac murmur, unspecified: Secondary | ICD-10-CM | POA: Diagnosis not present

## 2014-12-16 LAB — COMPREHENSIVE METABOLIC PANEL
ALK PHOS: 419 U/L — AB (ref 38–126)
ALT: 74 U/L — AB (ref 17–63)
ANION GAP: 10 (ref 5–15)
AST: 282 U/L — AB (ref 15–41)
Albumin: 3.3 g/dL — ABNORMAL LOW (ref 3.5–5.0)
BILIRUBIN TOTAL: 5.1 mg/dL — AB (ref 0.3–1.2)
BUN: 13 mg/dL (ref 6–20)
CHLORIDE: 83 mmol/L — AB (ref 101–111)
CO2: 29 mmol/L (ref 22–32)
Calcium: 7.8 mg/dL — ABNORMAL LOW (ref 8.9–10.3)
Creatinine, Ser: 1.23 mg/dL (ref 0.61–1.24)
GFR calc Af Amer: >60 mL/min (ref >60)
GFR calc non Af Amer: >60 mL/min (ref >60)
Glucose, Bld: 146 mg/dL — ABNORMAL HIGH (ref 65–99)
Potassium: 3.5 mmol/L (ref 3.5–5.1)
SODIUM: 122 mmol/L — AB (ref 135–145)
TOTAL PROTEIN: 6.7 g/dL (ref 6.5–8.1)

## 2014-12-16 LAB — CBC WITH DIFFERENTIAL/PLATELET
BASOS PCT: 1 %
Basophils Absolute: 0.1 10*3/uL (ref 0–0.1)
EOS ABS: 0.2 10*3/uL (ref 0–0.7)
EOS PCT: 3 %
HCT: 36.5 % — ABNORMAL LOW (ref 40.0–52.0)
Hemoglobin: 12.6 g/dL — ABNORMAL LOW (ref 13.0–18.0)
LYMPHS ABS: 1.4 10*3/uL (ref 1.0–3.6)
Lymphocytes Relative: 18 %
MCH: 31.7 pg (ref 26.0–34.0)
MCHC: 34.5 g/dL (ref 32.0–36.0)
MCV: 91.9 fL (ref 80.0–100.0)
MONOS PCT: 15 %
Monocytes Absolute: 1.1 10*3/uL — ABNORMAL HIGH (ref 0.2–1.0)
NEUTROS PCT: 63 %
Neutro Abs: 4.8 10*3/uL (ref 1.4–6.5)
Platelets: 107 10*3/uL — ABNORMAL LOW (ref 150–440)
RBC: 3.97 MIL/uL — ABNORMAL LOW (ref 4.40–5.90)
RDW: 15.1 % — ABNORMAL HIGH (ref 11.5–14.5)
WBC: 7.6 10*3/uL (ref 3.8–10.6)

## 2014-12-16 NOTE — Progress Notes (Signed)
Patient does have living will. Never smoked. 

## 2014-12-16 NOTE — Progress Notes (Signed)
[place] @ [facility] [phone]  [phone]     [name] Kneale Houchin Sr. [place]: 01-28-1956  MR#: [id number]  WNU#:[id number]  Patient Care Team: Glean Hess, MD as PCP - General (Family Medicine) Enis Gash, MD as Referring Physician (Gastroenterology) Harrell Lark, PHD as Referring Physician (Cardiology) Candee Furbish, MD as Referring Physician (Rheumatology) Forest Gleason, MD as Consulting Physician (Oncology)  CHIEF COMPLAINT:  Chief Complaint  Patient presents with  . Follow-up    CLL with lymphadenopathy, poor prognostic factors. HPI: INTERVAL HISTORY:   . 59 year old gentleman with chronic lymphocytic leukemia with high-risk prognostic factor in remission after 6 cycles of chemotherapy with bendamustine and Rituxan Patient had prolonged stay in rehabilitation independently in  [place]. Patient was then admitted at [facility] was was found to have atrial fibrillation.  Also being treated for gout with intravenous medication (patient has been allergic to allopurinol) Patient also has an enlarged liver and being managed by   [facility]  liver clinic. He has stopped drinking alcohol.  Does not smoke.  Appetite has been gradually improving.  Not working at present time.  Here for further followup regarding chronic lymphocytic leukemia. Patient is here for ongoing evaluation regarding chronic lymphocytic leukemia.. Patient has decided to quit job  today.  Continues to have problem with gout.  multiple problems but right now doing fairly good       REVIEW OF SYSTEMS:    general status: Patient is feeling weak and tired.  No change in a performance status.  No chills.  No fever. HEENT:   No evidence of stomatitis Lungs: No cough or shortness of breath Cardiac: No chest pain or paroxysmal nocturnal dyspnea GI: No nausea no vomiting no diarrhea no abdominal pain Skin: No rash Lower extremity no swelling Neurological system: No tingling.  No  numbness.  No other focal signs Musculoskeletal system no bony pains  As per HPI. Otherwise, a complete review of systems is negatve.  PAST MEDICAL HISTORY: Past Medical History  Diagnosis Date  . History of gout   . Hypertension   . GERD (gastroesophageal reflux disease)   . History of nephrolithiasis   . History of neuropathy     secondary to chemo  . Depression   . Anxiety   . Syncope and collapse   . CLL (chronic lymphocytic leukemia)     followed by Dr. Oliva Bustard; in remission   . Heart murmur     PAST SURGICAL HISTORY: Past Surgical History  Procedure Laterality Date  . Tonsillectomy and adenoidectomy    . Knee surgery      x 2  . Colonoscopy w/ polypectomy    . Knee surgery Left 04/2012    x2  . Cardiac electrophysiology mapping and ablation  09/2013    FAMILY HISTORY Family History  Problem Relation Age of Onset  . Colon cancer Mother   . Bladder Cancer Father   . Lymphoma Sister     ADVANCED DIRECTIVES:  No flowsheet data found.  HEALTH MAINTENANCE: Social History  Substance Use Topics  . Smoking status: Never Smoker   . Smokeless tobacco: None  . Alcohol Use: 0.0 oz/week    0 Standard drinks or equivalent per week     Comment: rarely      Allergies  Allergen Reactions  . Penicillins Anaphylaxis  . Allopurinol     Other reaction(s): Anaphylaxis  . Perampanel Other (See Comments)    Liver issues  . Uloric [Febuxostat]   .  Sulfa Antibiotics Rash    Current Outpatient Prescriptions  Medication Sig Dispense Refill  . amLODipine (NORVASC) 5 MG tablet Take 1 tablet by mouth daily.    . baclofen (LIORESAL) 10 MG tablet TAKE (1) TABLET BY MOUTH THREE TIMES A DAY 90 each 0  . colchicine 0.6 MG tablet Take 0.6 mg by mouth daily as needed.    . Cyanocobalamin (VITAMIN B 12 PO) Take by mouth every 30 (thirty) days.     . diphenhydrAMINE (SOMINEX) 25 MG tablet Take 25 mg by mouth at bedtime as needed for sleep.    . DULoxetine (CYMBALTA) 60 MG capsule  TAKE (1) CAPSULE BY MOUTH EVERY DAY 30 capsule 2  . fexofenadine-pseudoephedrine (ALLEGRA-D) 60-120 MG per tablet Take 1 tablet by mouth 2 (two) times daily.    . folic acid (FOLVITE) 1 MG tablet Take 1 tablet by mouth daily.    . furosemide (LASIX) 40 MG tablet Take 1 tablet (40 mg total) by mouth daily. 30 tablet 5  . gabapentin (NEURONTIN) 300 MG capsule Take 2 capsules (600 mg total) by mouth 3 (three) times daily. 180 capsule 5  . lactulose (CHRONULAC) 10 GM/15ML solution Take 15 mLs (10 g total) by mouth 2 (two) times daily. 946 mL 0  . lisinopril (PRINIVIL,ZESTRIL) 5 MG tablet Take 1 tablet (5 mg total) by mouth daily. 30 tablet 5  . Magnesium Oxide 400 MG CAPS Take 1 capsule (400 mg total) by mouth 2 (two) times daily. 60 capsule 5  . Melatonin 3 MG TABS Take 1 tablet (3 mg total) by mouth at bedtime. 30 tablet 0  . metoprolol succinate (TOPROL-XL) 50 MG 24 hr tablet Take 1 tablet by mouth daily.    . Multiple Vitamin (MULTIVITAMIN) tablet Take 1 tablet by mouth daily.    . Omega-3 Fatty Acids (FISH OIL) 1000 MG CAPS Take 1 capsule by mouth daily.    Marland Kitchen omeprazole (PRILOSEC) 10 MG capsule Take 2 capsules (20 mg total) by mouth daily. 30 capsule 5  . predniSONE (DELTASONE) 20 MG tablet Take 20 mg by mouth daily. taper    . spironolactone (ALDACTONE) 100 MG tablet Take 1 tablet (100 mg total) by mouth daily. 30 tablet 5  . thiamine 100 MG tablet Take by mouth.    . Ubiquinol 100 MG CAPS Take by mouth.    . zinc sulfate 220 MG capsule Take 1 capsule (220 mg total) by mouth daily. 30 capsule 5  . zolpidem (AMBIEN) 10 MG tablet TAKE (1) TABLET BY MOUTH DAILY AT BEDTIME 30 tablet 2  . apixaban (ELIQUIS) 5 MG TABS tablet Take 1 tablet by mouth 2 (two) times daily.    Marland Kitchen moxifloxacin (AVELOX) 400 MG tablet Take 1 tablet (400 mg total) by mouth daily. (Patient not taking: Reported on 12/16/2014) 7 tablet 0  . oxyCODONE (OXYCONTIN) 10 MG 12 hr tablet Take 10 mg by mouth every 4 (four) hours as needed  for pain.    . potassium chloride SA (K-DUR,KLOR-CON) 20 MEQ tablet Take 1 tablet (20 mEq total) by mouth daily. (Patient not taking: Reported on 12/16/2014) 30 tablet 5  . rifaximin (XIFAXAN) 550 MG TABS tablet Take 1 tablet (550 mg total) by mouth 2 (two) times daily. (Patient not taking: Reported on 12/16/2014) 60 tablet 0   No current facility-administered medications for this visit.    OBJECTIVE:  Filed Vitals:   12/16/14 1218  BP: 121/82  Pulse: 94  Temp: 97.3 F (36.3 C)  Body mass index is 36.36 kg/(m^2).    ECOG FS:1 - Symptomatic but completely ambulatory  PHYSICAL EXAM: General  status: Performance status is good.  Patient has not lost significant weight HEENT: No evidence of stomatitis. Sclera and conjunctivae :: No jaundice.   pale looking. Lungs: Air  entry equal on both sides.  No rhonchi.  No rales.  Cardiac: Heart sounds are normal.  No pericardial rub.  No murmur. Lymphatic system: Cervical, axillary, inguinal, lymph nodes not palpable GI: Abdomen is soft.  No ascites.  Liver spleen not palpable.  No tenderness.  Bowel sounds are within normal limit Lower extremity: No edema Neurological system: Higher functions, cranial nerves intact no evidence of peripheral neuropathy. Skin: No rash.  No ecchymosis.Marland Kitchen   LAB RESULTS:  CBC Latest Ref Rng 12/16/2014 05/12/2014  WBC 3.8 - 10.6 K/uL 7.6 11.0(H)  Hemoglobin 13.0 - 18.0 g/dL 12.6(L) 13.1  Hematocrit 40.0 - 52.0 % 36.5(L) 39.6(L)  Platelets 150 - 440 K/uL 107(L) 213    Appointment on 12/16/2014  Component Date Value Ref Range Status  . WBC 12/16/2014 7.6  3.8 - 10.6 K/uL Final  . RBC 12/16/2014 3.97* 4.40 - 5.90 MIL/uL Final  . Hemoglobin 12/16/2014 12.6* 13.0 - 18.0 g/dL Final  . HCT 12/16/2014 36.5* 40.0 - 52.0 % Final  . MCV 12/16/2014 91.9  80.0 - 100.0 fL Final  . MCH 12/16/2014 31.7  26.0 - 34.0 pg Final  . MCHC 12/16/2014 34.5  32.0 - 36.0 g/dL Final  . RDW 12/16/2014 15.1* 11.5 - 14.5 % Final  .  Platelets 12/16/2014 107* 150 - 440 K/uL Final  . Neutrophils Relative % 12/16/2014 63   Final  . Neutro Abs 12/16/2014 4.8  1.4 - 6.5 K/uL Final  . Lymphocytes Relative 12/16/2014 18   Final  . Lymphs Abs 12/16/2014 1.4  1.0 - 3.6 K/uL Final  . Monocytes Relative 12/16/2014 15   Final  . Monocytes Absolute 12/16/2014 1.1* 0.2 - 1.0 K/uL Final  . Eosinophils Relative 12/16/2014 3   Final  . Eosinophils Absolute 12/16/2014 0.2  0 - 0.7 K/uL Final  . Basophils Relative 12/16/2014 1   Final  . Basophils Absolute 12/16/2014 0.1  0 - 0.1 K/uL Final  . Sodium 12/16/2014 122* 135 - 145 mmol/L Final  . Potassium 12/16/2014 3.5  3.5 - 5.1 mmol/L Final  . Chloride 12/16/2014 83* 101 - 111 mmol/L Final  . CO2 12/16/2014 29  22 - 32 mmol/L Final  . Glucose, Bld 12/16/2014 146* 65 - 99 mg/dL Final  . BUN 12/16/2014 13  6 - 20 mg/dL Final  . Creatinine, Ser 12/16/2014 1.23  0.61 - 1.24 mg/dL Final  . Calcium 12/16/2014 7.8* 8.9 - 10.3 mg/dL Final  . Total Protein 12/16/2014 6.7  6.5 - 8.1 g/dL Final  . Albumin 12/16/2014 3.3* 3.5 - 5.0 g/dL Final  . AST 12/16/2014 282* 15 - 41 U/L Final  . ALT 12/16/2014 74* 17 - 63 U/L Final  . Alkaline Phosphatase 12/16/2014 419* 38 - 126 U/L Final  . Total Bilirubin 12/16/2014 5.1* 0.3 - 1.2 mg/dL Final  . GFR calc non Af Amer 12/16/2014 >60  >60 mL/min Final  . GFR calc Af Amer 12/16/2014 >60  >60 mL/min Final   Comment: (NOTE) The eGFR has been calculated using the CKD EPI equation. This calculation has not been validated in all clinical situations. eGFR's persistently <60 mL/min signify possible Chronic Kidney Disease.   . Anion gap 12/16/2014 10  5 - 15 Final          ASSESSMENT: Monica lymphocytic leukemia status post chemotherapy with Rituxan and bendamustine No evidence of recurrent or progressive disease  MEDICAL DECISION MAKING:  Lab data has been reviewed.  Patient's follow-up from [facility] in [facility] has been reviewed. Return  appointment in 6 months or before if patient develops any symptoms  Patient expressed understanding and was in agreement with this plan. He also understands that He can call clinic at any time with any questions, concerns, or complaints.    No matching staging information was found for the patient.  Forest Gleason, MD   12/16/2014 2:43 PM

## 2014-12-23 ENCOUNTER — Encounter: Payer: Self-pay | Admitting: Family Medicine

## 2014-12-23 ENCOUNTER — Ambulatory Visit (INDEPENDENT_AMBULATORY_CARE_PROVIDER_SITE_OTHER): Payer: Managed Care, Other (non HMO) | Admitting: Family Medicine

## 2014-12-23 VITALS — HR 62 | Ht 70.0 in | Wt 250.0 lb

## 2014-12-23 DIAGNOSIS — E871 Hypo-osmolality and hyponatremia: Secondary | ICD-10-CM

## 2014-12-23 DIAGNOSIS — R42 Dizziness and giddiness: Secondary | ICD-10-CM | POA: Diagnosis not present

## 2014-12-23 DIAGNOSIS — R4182 Altered mental status, unspecified: Secondary | ICD-10-CM

## 2014-12-23 DIAGNOSIS — I482 Chronic atrial fibrillation, unspecified: Secondary | ICD-10-CM

## 2014-12-23 DIAGNOSIS — K7031 Alcoholic cirrhosis of liver with ascites: Secondary | ICD-10-CM

## 2014-12-23 DIAGNOSIS — F1020 Alcohol dependence, uncomplicated: Secondary | ICD-10-CM

## 2014-12-23 NOTE — Progress Notes (Signed)
Name: [name] Arredondo Goetsch Sr.   MRN: [id number]    DOB: 11/29/1955   Date:12/23/2014       Progress Note  Subjective  Chief Complaint  Chief Complaint  Patient presents with  . Hypotension    98/58  . Tachycardia    114  . Dehydration    HPI Comments: Patient notes increased pulse rate and low blood pressure at home.  He has increased time in bed with questionable oral intake.  There has been a gradual change in mental status over the past 48 hours.  Recent evaluation at oncology notes hyponatremia and orthostatic pressure changes suggest a hypovolemic state.     No problem-specific assessment & plan notes found for this encounter.   Past Medical History  Diagnosis Date  . History of gout   . Hypertension   . GERD (gastroesophageal reflux disease)   . History of nephrolithiasis   . History of neuropathy     secondary to chemo  . Depression   . Anxiety   . Syncope and collapse   . CLL (chronic lymphocytic leukemia)     followed by Dr. Oliva Bustard; in remission   . Heart murmur     Past Surgical History  Procedure Laterality Date  . Tonsillectomy and adenoidectomy    . Knee surgery      x 2  . Colonoscopy w/ polypectomy    . Knee surgery Left 04/2012    x2  . Cardiac electrophysiology mapping and ablation  09/2013    Family History  Problem Relation Age of Onset  . Colon cancer Mother   . Bladder Cancer Father   . Lymphoma Sister     Social History   Social History  . Marital Status: Married    Spouse Name: N/A  . Number of Children: N/A  . Years of Education: N/A   Occupational History  . Not on file.   Social History Main Topics  . Smoking status: Never Smoker   . Smokeless tobacco: Not on file  . Alcohol Use: 0.0 oz/week    0 Standard drinks or equivalent per week     Comment: rarely  . Drug Use: No     Comment: past  . Sexual Activity: No   Other Topics Concern  . Not on file   Social History Narrative    Allergies  Allergen Reactions  .  Penicillins Anaphylaxis  . Allopurinol     Other reaction(s): Anaphylaxis  . Perampanel Other (See Comments)    Liver issues  . Uloric [Febuxostat]   . Sulfa Antibiotics Rash     Review of Systems  Constitutional: Positive for malaise/fatigue. Negative for fever, chills and weight loss.  HENT: Negative for ear discharge, ear pain and sore throat.   Eyes: Negative for blurred vision.  Respiratory: Negative for cough, sputum production, shortness of breath and wheezing.   Cardiovascular: Negative for chest pain, palpitations and leg swelling.  Gastrointestinal: Negative for heartburn, nausea, abdominal pain, diarrhea, constipation, blood in stool and melena.  Genitourinary: Negative for dysuria, urgency, frequency and hematuria.  Musculoskeletal: Negative for myalgias, back pain, joint pain and neck pain.  Skin: Negative for rash.  Neurological: Positive for weakness. Negative for dizziness, tingling, sensory change, focal weakness and headaches.  Endo/Heme/Allergies: Negative for environmental allergies and polydipsia. Does not bruise/bleed easily.  Psychiatric/Behavioral: Negative for depression and suicidal ideas. The patient is not nervous/anxious and does not have insomnia.      Objective  Filed Vitals:  12/23/14 1032  Pulse: 62  Height: 5\' 10"  (1.778 m)  Weight: 250 lb (113.399 kg)    Physical Exam  Constitutional: He is oriented to person, place, and time and well-developed, well-nourished, and in no distress.  HENT:  Head: Normocephalic.  Right Ear: External ear normal.  Left Ear: External ear normal.  Nose: Nose normal.  Mouth/Throat: Oropharynx is clear and moist.  Eyes: Conjunctivae and EOM are normal. Pupils are equal, round, and reactive to light. Right eye exhibits no discharge. Left eye exhibits no discharge. No scleral icterus.  Neck: Normal range of motion. Neck supple. No JVD present. No tracheal deviation present. No thyromegaly present.  Cardiovascular:  Normal rate, regular rhythm, normal heart sounds and intact distal pulses.  Exam reveals no gallop and no friction rub.   No murmur heard. Pulmonary/Chest: Breath sounds normal. No respiratory distress. He has no wheezes. He has no rales.  Abdominal: Soft. Bowel sounds are normal. He exhibits no mass. There is hepatomegaly. There is no hepatosplenomegaly or splenomegaly. There is no tenderness. There is no rebound, no guarding and no CVA tenderness.  Musculoskeletal: Normal range of motion. He exhibits no edema or tenderness.  Lymphadenopathy:    He has no cervical adenopathy.  Neurological: He is alert and oriented to person, place, and time. He has normal sensation, normal strength, normal reflexes and intact cranial nerves. No cranial nerve deficit.  Skin: Skin is warm and dry. No rash noted.  ? Jaundice/ without scleral icteris  Psychiatric: Mood and affect normal.  Nursing note and vitals reviewed.     Assessment & Plan  Problem List Items Addressed This Visit      Cardiovascular and Mediastinum   A-fib     Digestive   ALC (alcoholic liver cirrhosis)     Other   Alcoholic    Other Visit Diagnoses    Dehydration with hyponatremia    -  Primary    referral to er for eval of status of hyponatremia and possible exacerbation alcoholic liver disease /? elevated ammonia/encephalopathy    Altered mental status, unspecified altered mental status type        Orthostatic lightheadedness             Dr. Otilio Miu [facility]  12/23/2014

## 2014-12-27 DIAGNOSIS — G4753 Recurrent isolated sleep paralysis: Secondary | ICD-10-CM | POA: Insufficient documentation

## 2014-12-28 DIAGNOSIS — F10988 Alcohol use, unspecified with other alcohol-induced disorder: Secondary | ICD-10-CM

## 2014-12-28 DIAGNOSIS — F10188 Alcohol abuse with other alcohol-induced disorder: Secondary | ICD-10-CM | POA: Insufficient documentation

## 2015-01-13 ENCOUNTER — Other Ambulatory Visit: Payer: Self-pay | Admitting: Internal Medicine

## 2015-01-17 ENCOUNTER — Other Ambulatory Visit: Payer: Self-pay | Admitting: Internal Medicine

## 2015-01-17 ENCOUNTER — Ambulatory Visit (INDEPENDENT_AMBULATORY_CARE_PROVIDER_SITE_OTHER): Payer: Managed Care, Other (non HMO) | Admitting: Internal Medicine

## 2015-01-17 ENCOUNTER — Encounter: Payer: Self-pay | Admitting: Internal Medicine

## 2015-01-17 VITALS — BP 124/82 | HR 112 | Temp 98.4°F | Ht 70.0 in | Wt 256.6 lb

## 2015-01-17 DIAGNOSIS — M109 Gout, unspecified: Secondary | ICD-10-CM

## 2015-01-17 DIAGNOSIS — I10 Essential (primary) hypertension: Secondary | ICD-10-CM | POA: Diagnosis not present

## 2015-01-17 DIAGNOSIS — G4753 Recurrent isolated sleep paralysis: Secondary | ICD-10-CM

## 2015-01-17 DIAGNOSIS — M1A9XX1 Chronic gout, unspecified, with tophus (tophi): Secondary | ICD-10-CM

## 2015-01-17 DIAGNOSIS — G629 Polyneuropathy, unspecified: Secondary | ICD-10-CM | POA: Insufficient documentation

## 2015-01-17 DIAGNOSIS — J40 Bronchitis, not specified as acute or chronic: Secondary | ICD-10-CM | POA: Diagnosis not present

## 2015-01-17 DIAGNOSIS — K7031 Alcoholic cirrhosis of liver with ascites: Secondary | ICD-10-CM

## 2015-01-17 DIAGNOSIS — E291 Testicular hypofunction: Secondary | ICD-10-CM | POA: Diagnosis not present

## 2015-01-17 MED ORDER — AZITHROMYCIN 250 MG PO TABS: ORAL_TABLET | ORAL | Status: AC

## 2015-01-17 NOTE — Progress Notes (Signed)
Date:  01/17/2015   Name:  [name] Oyama Lindenbaum Sr.   DOB:  12/08/1955   MRN:  [id number]   Chief Complaint: Cough Cough This is a new problem. The current episode started in the past 7 days. The problem has been unchanged. The problem occurs every few minutes. The cough is productive of brown sputum. Pertinent negatives include no chest pain, chills, fever, nasal congestion, postnasal drip, shortness of breath or wheezing. The symptoms are aggravated by dust and exercise. Risk factors for lung disease include occupational exposure (Symptoms began after he spent all day cleaning the house and doing a lot of dusting).  Hypertension This is a chronic problem. The problem is unchanged. The problem is controlled. Pertinent negatives include no chest pain, palpitations or shortness of breath.   Sleep paralysis - patient was diagnosed with sleep paralysis when he was in the hospital recently. He was started on clomipramine and has had no further symptoms.  Hypogonadism -patient was diagnosed with extremely low testosterone 58 and his most recent hospitalization. He was placed on androgen term patches 4 mg daily. Since beginning treatment he has noted significant increase in his energy level. He's also had more libido and some success with erections.  Chronic insomnia - patient was previously on Ambien however this was discontinued. He is now on Sonata 10 mg and that is working well. Review of Systems:  Review of Systems  Constitutional: Negative for fever and chills.  HENT: Negative for hearing loss, mouth sores, nosebleeds and postnasal drip.   Respiratory: Positive for cough. Negative for shortness of breath and wheezing.   Cardiovascular: Negative for chest pain, palpitations and leg swelling.  Gastrointestinal: Positive for abdominal distention. Negative for abdominal pain, constipation and anal bleeding.  Neurological: Negative for syncope and weakness.  Psychiatric/Behavioral: Negative for decreased  concentration.    Patient Active Problem List   Diagnosis Date Noted  . Neuropathy 01/17/2015  . Alcohol abuse with alcohol-induced mental disorder 12/28/2014  . Recurrent isolated sleep paralysis 12/27/2014  . ALC (alcoholic liver cirrhosis) 10/01/2014  . Alcoholic 64/33/2951  . A-fib 10/01/2014  . 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 Acid reflux 10/01/2014  . 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 Hypomagnesemia 10/01/2014  . Insomnia, persistent 10/01/2014  . Depression, major, single episode, in partial remission 10/01/2014  . Drug related polyneuropathy 10/01/2014  . Addison anemia 10/01/2014  . Change in blood platelet count 10/01/2014  . Deficiency of macronutrients 08/30/2014  . Neurocardiogenic syncope 03/20/2013  . Syncope 11/04/2012  . CLL (chronic lymphocytic leukemia) 11/04/2012  . Essential hypertension 11/04/2012    Prior to Admission medications   Medication Sig Start Date End Date Taking? Authorizing Provider  amLODipine (NORVASC) 5 MG tablet Take 1 tablet by mouth daily. 07/14/13  Yes Historical Provider, MD  baclofen (LIORESAL) 10 MG tablet TAKE (1) TABLET BY MOUTH THREE TIMES A DAY Patient taking differently: take 2 tablets three times a day 12/14/14  Yes Glean Hess, MD  clomiPRAMINE (ANAFRANIL) 25 MG capsule Take by mouth. 12/28/14 12/28/15 Yes Historical Provider, MD  colchicine 0.6 MG tablet Take 0.6 mg by mouth daily as needed.   Yes Historical Provider, MD  Cyanocobalamin (VITAMIN B 12 PO) Take by mouth every 30 (thirty) days.    Yes Historical Provider, MD  fexofenadine-pseudoephedrine (ALLEGRA-D) 60-120 MG per tablet Take 1 tablet by mouth 2 (two) times daily.   Yes Historical Provider, MD  folic acid (FOLVITE) 1 MG tablet Take 1 tablet by mouth daily.   Yes  Historical Provider, MD  gabapentin (NEURONTIN) 300 MG capsule Take 2 capsules (600 mg total) by mouth 3 (three) times daily. 11/05/14  Yes Glean Hess, MD  lactulose (CHRONULAC) 10 GM/15ML solution TAKE  15MLS BY MOUTH TWICE DAILY AS DIRECTED BY PHYSICIAN. 01/13/15  Yes Glean Hess, MD  lisinopril (PRINIVIL,ZESTRIL) 5 MG tablet Take 1 tablet (5 mg total) by mouth daily. 11/05/14  Yes Glean Hess, MD  Magnesium Oxide 400 MG CAPS Take 1 capsule (400 mg total) by mouth 2 (two) times daily. Patient taking differently: Take 2 capsules by mouth 3 (three) times daily.  11/05/14  Yes Glean Hess, MD  Melatonin 3 MG TABS Take 1 tablet (3 mg total) by mouth at bedtime. 11/05/14  Yes Glean Hess, MD  memantine [facility]) 10 MG tablet Take by mouth. 12/28/14 12/28/15 Yes Historical Provider, MD  metoprolol succinate (TOPROL-XL) 50 MG 24 hr tablet Take 0.5 tablets by mouth 2 (two) times daily.    Yes Historical Provider, MD  Multiple Vitamin (MULTIVITAMIN) tablet Take 1 tablet by mouth daily.   Yes Historical Provider, MD  Omega-3 Fatty Acids (FISH OIL) 1000 MG CAPS Take 1 capsule by mouth daily.   Yes Historical Provider, MD  omeprazole (PRILOSEC) 10 MG capsule Take 2 capsules (20 mg total) by mouth daily. 11/05/14  Yes Glean Hess, MD  potassium chloride SA (K-DUR,KLOR-CON) 20 MEQ tablet Take 1 tablet (20 mEq total) by mouth daily. 11/05/14  Yes Glean Hess, MD  predniSONE (DELTASONE) 20 MG tablet Take 20 mg by mouth daily. taper   Yes Historical Provider, MD  spironolactone (ALDACTONE) 100 MG tablet Take 1 tablet (100 mg total) by mouth daily. 11/05/14  Yes Glean Hess, MD  testosterone Renae Gloss) 4 MG/24HR PT24 patch Place onto the skin. 12/28/14 01/27/15 Yes Historical Provider, MD  thiamine 100 MG tablet Take by mouth.   Yes Historical Provider, MD  zaleplon (SONATA) 10 MG capsule Take 1 capsule by mouth at bedtime. 12/28/14 01/27/15 Yes Historical Provider, MD  zinc sulfate 220 MG capsule Take 1 capsule (220 mg total) by mouth daily. 11/05/14  Yes Glean Hess, MD  diphenhydrAMINE (SOMINEX) 25 MG tablet Take 25 mg by mouth at bedtime as needed for sleep.    Historical Provider, MD   DULoxetine (CYMBALTA) 60 MG capsule TAKE (1) CAPSULE BY MOUTH EVERY DAY Patient not taking: Reported on 01/17/2015 12/14/14   Glean Hess, MD  furosemide (LASIX) 40 MG tablet Take 1 tablet (40 mg total) by mouth daily. Patient not taking: Reported on 01/17/2015 11/05/14 11/05/15  Glean Hess, MD    Allergies  Allergen Reactions  . Penicillins Anaphylaxis  . Allopurinol     Other reaction(s): Anaphylaxis  . Perampanel Other (See Comments)    Liver issues  . Uloric [Febuxostat]   . Sulfa Antibiotics Rash    Past Surgical History  Procedure Laterality Date  . Tonsillectomy and adenoidectomy    . Knee surgery      x 2  . Colonoscopy w/ polypectomy    . Knee surgery Left 04/2012    x2  . Cardiac electrophysiology mapping and ablation  09/2013    Social History  Substance Use Topics  . Smoking status: Never Smoker   . Smokeless tobacco: None  . Alcohol Use: 0.0 oz/week    0 Standard drinks or equivalent per week     Comment: rarely     Medication list has been reviewed and updated.  Physical Examination:  Physical Exam  Constitutional: He appears well-developed and well-nourished.  Neck: Normal range of motion. No thyromegaly present.  Cardiovascular: S1 normal.  Tachycardia present.   Pulmonary/Chest: Effort normal and breath sounds normal. He has no decreased breath sounds. He has no wheezes.  Abdominal: Soft. He exhibits distension. There is no tenderness.  Neurological: He is alert.  Psychiatric: He has a normal mood and affect. His speech is normal.  Nursing note and vitals reviewed.   BP 124/82 mmHg  Pulse 112  Temp(Src) 98.4 F (36.9 C)  Ht 5\' 10"  (1.778 m)  Wt 256 lb 9.6 oz (116.393 kg)  BMI 36.82 kg/m2  SpO2 97%  Assessment and Plan: 1. Bronchitis - azithromycin (ZITHROMAX Z-PAK) 250 MG tablet; Take 2 on day #1 then one a day until gone  Dispense: 6 each; Refill: 0  2. Recurrent isolated sleep paralysis Now on clomipramine  3. Chronic  tophaceous gout Stable without worse recurrence, pain better controlled with gabapentin  4. Essential hypertension Stable on current medication  5. Eunuchoidism Now on Androderm patches, he will call for refill when needed  6. Alcoholic cirrhosis of liver with ascites Patient continues to drink some but denies binges He'll continue on his current medications and follow up with the GI clinic in the liver clinic as instructed.   Halina Maidens, MD [facility]  01/17/2015

## 2015-01-28 ENCOUNTER — Other Ambulatory Visit: Payer: Self-pay

## 2015-01-28 MED ORDER — ZALEPLON 10 MG PO CAPS: 10.0000 mg | ORAL_CAPSULE | Freq: Every day | ORAL | Status: AC

## 2015-02-11 ENCOUNTER — Other Ambulatory Visit: Payer: Self-pay | Admitting: Internal Medicine

## 2015-03-03 ENCOUNTER — Ambulatory Visit: Payer: Managed Care, Other (non HMO) | Admitting: Internal Medicine

## 2015-03-03 ENCOUNTER — Other Ambulatory Visit: Payer: Self-pay | Admitting: Internal Medicine

## 2015-03-03 DIAGNOSIS — I4891 Unspecified atrial fibrillation: Secondary | ICD-10-CM | POA: Insufficient documentation

## 2015-03-03 DIAGNOSIS — D696 Thrombocytopenia, unspecified: Secondary | ICD-10-CM | POA: Insufficient documentation

## 2015-03-03 DIAGNOSIS — G620 Drug-induced polyneuropathy: Secondary | ICD-10-CM | POA: Insufficient documentation

## 2015-03-08 DEATH — deceased

## 2015-04-02 IMAGING — CR DG CHEST 1V PORT
1 series · 1 of 1 positions shown · non-contrast
Comparison: 08/07/2012

REASON FOR EXAM: hypoxemia
COMMENTS:

PROCEDURE:     DXR - DXR PORTABLE CHEST SINGLE VIEW  - September 15, 2013  [DATE]
CLINICAL DATA: Weakness
EXAM:
PORTABLE CHEST - 1 VIEW

[ap]
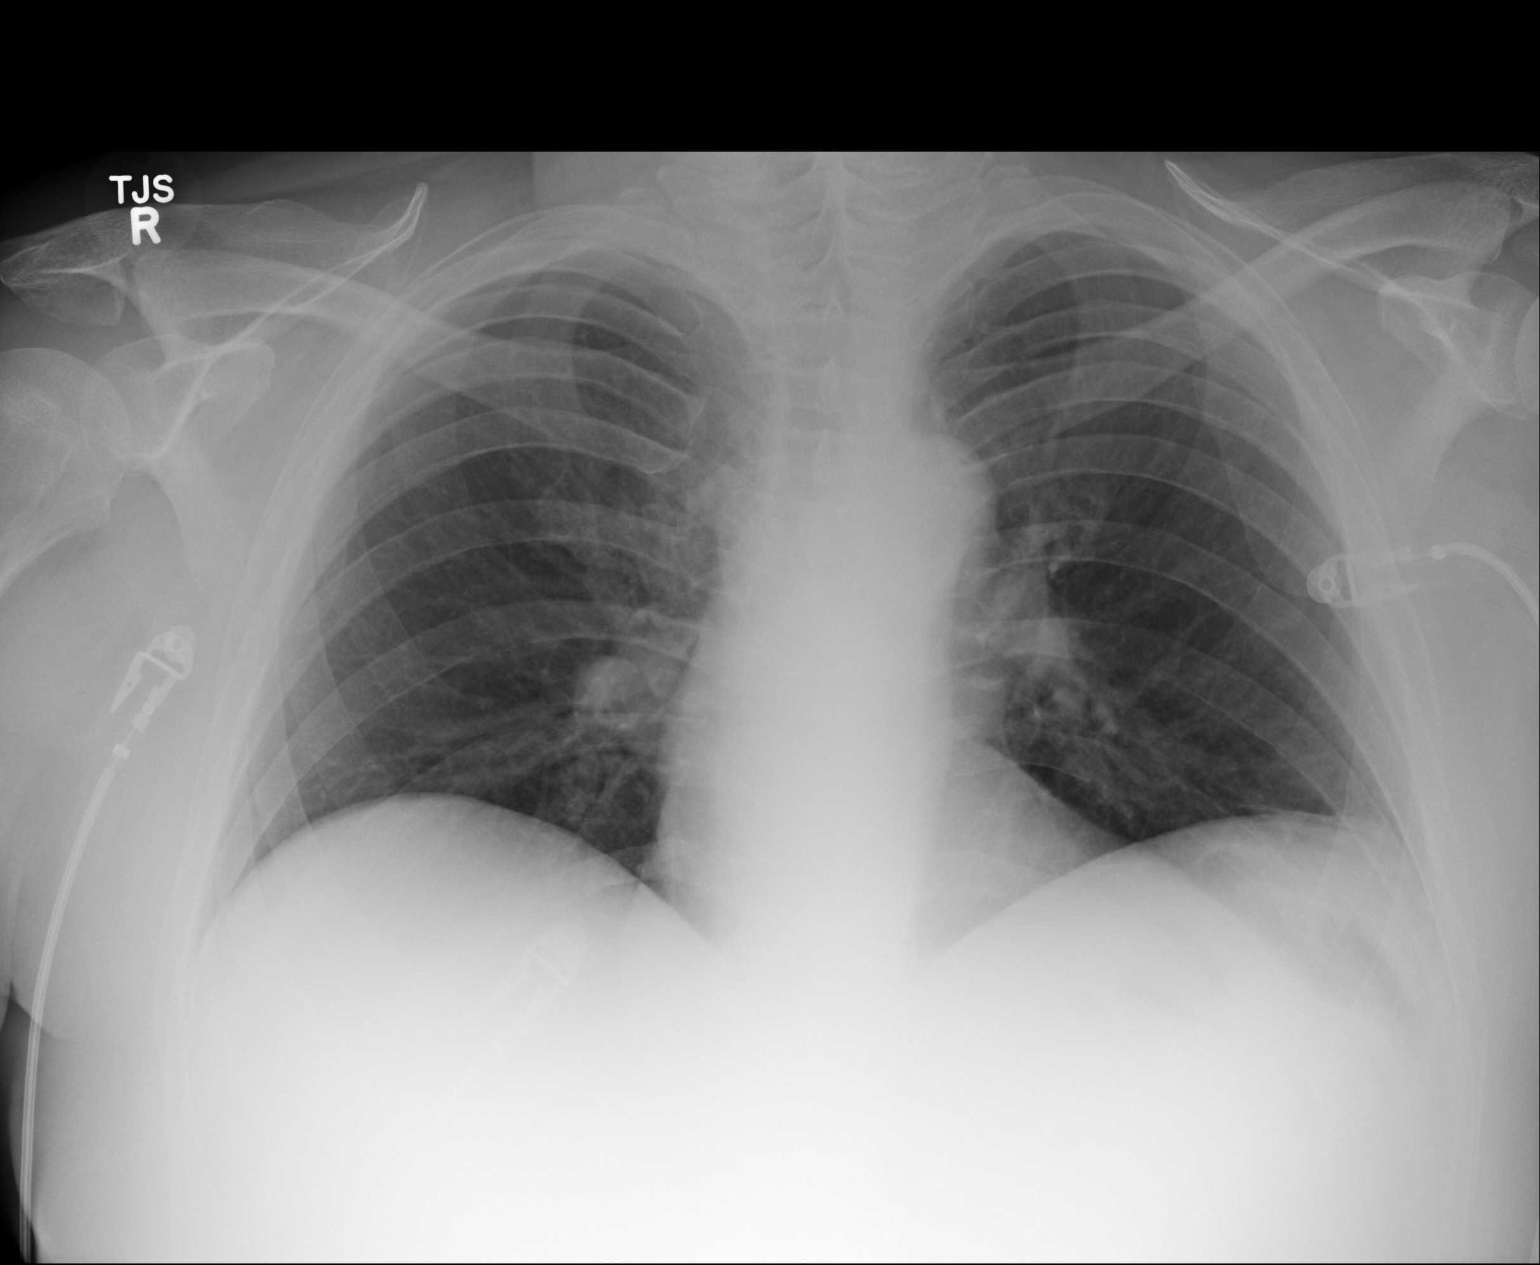

[1 of 1 positions shown; findings below may reference images not displayed]

FINDINGS: Cardiac shadow is within normal limits. The lungs are well aerated
with mild left basilar atelectasis. No sizable effusion is seen. No
bony abnormality is noted.
IMPRESSION: Mild left basilar atelectasis.

## 2015-06-21 ENCOUNTER — Other Ambulatory Visit: Payer: Managed Care, Other (non HMO)

## 2015-06-21 ENCOUNTER — Ambulatory Visit: Payer: Managed Care, Other (non HMO) | Admitting: Oncology
